# Patient Record
Sex: Male | Born: 1955 | Race: White | Hispanic: No | Marital: Married | Smoking: Former smoker
Health system: Southern US, Community
[De-identification: ages and names within clinical notes are randomized; demographics above are authoritative.]

## PROBLEM LIST (undated history)

## (undated) DIAGNOSIS — I6529 Occlusion and stenosis of unspecified carotid artery: Secondary | ICD-10-CM

## (undated) DIAGNOSIS — I10 Essential (primary) hypertension: Secondary | ICD-10-CM

## (undated) HISTORY — PX: CAROTID STENT: SHX1301

## (undated) HISTORY — PX: TOE SURGERY: SHX1073

## (undated) HISTORY — DX: Occlusion and stenosis of unspecified carotid artery: I65.29

## (undated) HISTORY — PX: AORTA SURGERY: SHX548

## (undated) HISTORY — DX: Essential (primary) hypertension: I10

---

## 2005-02-06 ENCOUNTER — Emergency Department: Payer: Self-pay | Admitting: General Practice

## 2005-02-12 ENCOUNTER — Ambulatory Visit: Payer: Self-pay | Admitting: Podiatry

## 2006-10-25 ENCOUNTER — Emergency Department: Payer: Self-pay | Admitting: Emergency Medicine

## 2006-10-28 ENCOUNTER — Inpatient Hospital Stay (HOSPITAL_COMMUNITY): Admission: RE | Admit: 2006-10-28 | Discharge: 2006-11-02 | Payer: Self-pay | Admitting: Vascular Surgery

## 2006-10-28 ENCOUNTER — Encounter (INDEPENDENT_AMBULATORY_CARE_PROVIDER_SITE_OTHER): Payer: Self-pay | Admitting: Specialist

## 2006-10-28 ENCOUNTER — Encounter: Payer: Self-pay | Admitting: Vascular Surgery

## 2007-07-04 ENCOUNTER — Ambulatory Visit: Payer: Self-pay | Admitting: Vascular Surgery

## 2008-05-23 ENCOUNTER — Ambulatory Visit: Payer: Self-pay | Admitting: Vascular Surgery

## 2008-11-20 ENCOUNTER — Ambulatory Visit: Payer: Self-pay | Admitting: Vascular Surgery

## 2011-03-09 NOTE — Procedures (Signed)
BYPASS GRAFT EVALUATION   INDICATION:  Followup aortic stenosis.   HISTORY:  Diabetes:  No.  Cardiac:  No.  Hypertension:  No.  Smoking:  Yes.  Previous Surgery:  Status post aortobifem bypass graft 10/28/2006 by Dr.  Edilia Bo.   SINGLE LEVEL ARTERIAL EXAM                               RIGHT              LEFT  Brachial:                    160                152  Anterior tibial:             171                158  Posterior tibial:            183                161  Peroneal:  Ankle/brachial index:        1.14               1.01   PREVIOUS ABI:  Date: 10/28/2006  RIGHT:  1.11  LEFT:  1.06   LOWER EXTREMITY BYPASS GRAFT DUPLEX EXAM:  See attached sheet for  velocities.   DUPLEX:  Evidence of triphasic Doppler wave forms proximal to, within,  and distal to bypass graft.   IMPRESSION:  1. Patent aortobifem bypass graft.  2. Normal ABIs showing no significant changes from previous study.   ___________________________________________  Di Kindle. Edilia Bo, M.D.   AS/MEDQ  D:  07/04/2007  T:  07/05/2007  Job:  [id number]

## 2011-03-09 NOTE — Procedures (Signed)
BYPASS GRAFT EVALUATION   INDICATION:  Follow-up aortobifemoral bypass graft.   HISTORY:  Diabetes:  No.  Cardiac:  No.  Hypertension:  No.  Smoking:  Yes.  Previous Surgery:  Aortobifemoral bypass graft on 10/28/06.   SINGLE LEVEL ARTERIAL EXAM                               RIGHT              LEFT  Brachial:                    142                128  Anterior tibial:             152                144  Posterior tibial:            148                154  Peroneal:  Ankle/brachial index:        1.07               1.08   PREVIOUS ABI:  Date: 07/04/07  RIGHT:  1.14  LEFT:  1.01   LOWER EXTREMITY BYPASS GRAFT DUPLEX EXAM:   DUPLEX:  Biphasic to triphasic waveforms noted throughout the  aortobifemoral bypass graft and its native vessels with no increase in  Doppler velocities noted.   IMPRESSION:  1. Patent aortobifemoral bypass graft with no evidence of stenosis      noted.  2. Stable bilateral ankle brachial indices noted.   ___________________________________________  Di Kindle. Edilia Bo, M.D.   CH/MEDQ  D:  05/23/2008  T:  05/23/2008  Job:  [id number]

## 2011-03-09 NOTE — Assessment & Plan Note (Signed)
OFFICE VISIT   [name], [name]  DOB:  [birth year]                                       07/04/2007  CHART#:[id number]   I saw [name] in the office today for followup after his previous  aorto-femoral bypass graft.  He had presented with a long history of  claudication, and his symptoms had acutely worsened in December 2007.  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  He  underwent aorto-femoral bypass grafting on 10/28/2006.  He comes in for  a routine followup visit.  Since I saw him last he has had no  claudication, rest pain or nonhealing ulcers.  His appetite has returned  to normal, and he is working without any problems.   On review of systems he has had no chest pain, chest pressure,  palpitations or arrhythmias.  He has had no bronchitis, asthma or  wheezing.   On physical examination blood pressure is 136/80, heart rate is 68.  I  do not detect any carotid bruits.  Lungs are clear bilaterally to  auscultation.  On cardiac exam he has a regular rate and rhythm. His  abdomen is soft and nontender.  His incision has healed nicely.  He has  palpable femoral, dorsalis pedis and posterior tibial pulses  bilaterally.  He has no significant lower extremity swelling.  His groin  incisions look fine.   Overall I am pleased with his progress.  We have again discussed the  importance of tobacco cessation.  I plan on seeing him back in 1 year.  He knows to call sooner if he has problems.  We  have also discussed the use of prophylactic antibiotics if he has any  invasive procedures performed.   Di Kindle. Edilia Bo, M.D.  Electronically Signed   CSD/MEDQ  D:  07/04/2007  T:  07/05/2007  Job:  [id number]

## 2011-03-12 NOTE — Discharge Summary (Signed)
[name], [name] NO.:  [id number]   MEDICAL RECORD NO.:  [id number]          PATIENT TYPE:  INP   LOCATION:  2018                         FACILITY:  [facility]   PHYSICIAN:  Di Kindle. Edilia Bo, M.D.DATE OF BIRTH:  09/03/1956   DATE OF ADMISSION:  10/28/2006  DATE OF DISCHARGE:                               DISCHARGE SUMMARY   DATE OF DISCHARGE:  Tentatively 1-2 days.   ADMISSION DIAGNOSIS:  Aortic occlusion with disabling claudication and  rest pain.   DISCHARGE DIAGNOSES:  1. Aortic occlusion, status post aortobifemoral bypass graft.  2. Postoperative incision with mild cellulitis.  3. Postoperative leukocytosis which resolved.  4. Questionable hypercholesterolemia.   CONSULTANT:  None.   PROCEDURES:  October 28, 2006, the patient underwent aortobifemoral  bypass grafting by Dr. Edilia Bo.   HISTORY AND PHYSICAL:  This is a 55 year old gentleman who presented  with a three to four-years' long history of hip, thigh, and calf  claudication.  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  The patient  was brought in for aortofemoral bypass grafting.  Please see dictated  history and physical for further details.   HOSPITAL COURSE:  The patient's hospital course has been uneventful and  the patient has been progressing quite well.  The patient underwent  aortofemoral bypass grafting on October 28, 2006, by Dr. Edilia Bo without  any complications.  The patient was then admitted to the SICU.  He was  extubated post-op day #1 and comfortable.  The patient did receive with  a PCA for pain control.  The patient did tolerate this quite well.  The  patient was able to start p.o. pain medications by post-op day #3.  The  patient was started on a nicotine patch on  post-op day #1.  The patient  has remained hemodynamically stable and did not require any blood  transfusions.  His hemoglobin hematocrit have been around 11.2 and 31.8.  The patient's renal function has remained intact, a BUN 13, creatinine  1.2.  Post-op day #2, the patient did have some leukocytosis.  The  patient remained afebrile, did have a urinalysis checked.  The patient's  urinalysis was negative.  However on post-op day #3, the patient was  found to have mild cellulitis on his abdominal incision site and he was  started on Keflex.  Post-op day #4, the patient's temperature was back  down to 96.8 and his cellulitis did slightly improve.  Postoperatively, the patient was positive for flatus on post-op day #2.  He did have a bowel movement on post-op  day #3.  The patient does have  good bowel sounds on post-op day #2.  He was started on liquids post-op  day #3 and his diet was advanced as tolerated.  The patient is  ambulating well.  He has continued his breathing exercises  appropriately.  The patient's blood pressure has remained  well-  controlled in the lower 120s over 60s.  He is breathing on room air at  91% oxygen sat.   DISCHARGE PHYSICAL EXAMINATION:  VITAL SIGNS:  The patient is afebrile  at 96.8.  Blood pressure 116/68.  Heart rate 85.  O2 sat is 97%.  SKIN:  His incision shows a mild cellulitis improvement.  ABDOMEN:  Positive bowel sounds x4.  Soft, nontender, nondistended.  LUNGS:  Clear to auscultation bilaterally.  HEART:  Regular rate rhythm.  Palpable pedal pulses.   DISCHARGE DISPOSITION:  The patient will be discharged home in the next  1-2 days.   MEDICATIONS:  1. Aspirin 325 mg p.o. daily.  2. Tylenol Sinus p.r.n.  3. Multivitamin daily.  4. Keflex 500 mg t.i.d. x12 days.  5. Tylox 5 mg every 4 hours.   INSTRUCTIONS:  The patient is instructed to follow a low fat, low salt  diet.  No driving or lifting greater than 10 pounds for 3 weeks.  The  patient is to ambulate 3-4 times daily and increase activity as  tolerated.  He is to continue his breathing exercises.  He may shower and clean his incisions with mild soap and water.   The patient is to call the office if any problems shall arise.   FOLLOWUP:  The patient has a followup appointment with Dr. Edilia Bo in 3  weeks.  The office will contact him with time and date of appointment.  At that time, the patient's staples will be removed.      Constance Holster, PA      Di Kindle. Edilia Bo, M.D.  Electronically Signed    JMW/MEDQ  D:  11/01/2006  T:  11/01/2006  Job:  [id number]

## 2011-03-12 NOTE — H&P (Signed)
[name], MANERS NO.:  [id number]   MEDICAL RECORD NO.:  [id number]           PATIENT TYPE:   LOCATION:                                 FACILITY:   PHYSICIAN:  Di Kindle. Edilia Bo, M.D.DATE OF BIRTH:  1956-07-09   DATE OF ADMISSION:  DATE OF DISCHARGE:                              HISTORY & PHYSICAL   REASON FOR ADMISSION:  Aortic occlusion.   HISTORY OF PRESENT ILLNESS:  This is a pleasant 55 year old gentleman  who gives a 3-4 year history of bilateral lower extremity claudication  that has involved his hips, thighs and calves.  Initially the symptoms  had begun on the right side but later involved both the right and left  legs.  However, symptoms had remained relatively stable and he could  walk generally approximately 300 yards before experiencing significant  disabling symptoms.  The symptoms had remained stable until October 17, 2006, when he experienced the fairly sudden onset of pain in both lower  extremities.  This pain persisted over the next week and yesterday he  presented to the emergency department at [facility],  where a CT angio documented occlusion of the aorta from the level the  renal arteries down to the iliac bifurcations.  The situation was  discussed with Dr. Hart Rochester, and he was brought in today to be seen in the  office.   This patient denies any specific rest pain in his legs, although he  states that since he had the acute change in his symptoms on October 17, 2006, he has had paresthesias in his legs.  He has had no history of  nonhealing wounds.   PAST MEDICAL HISTORY:  Fairly unremarkable.  He denies any history of  diabetes, hypertension, hypercholesterolemia, history of previous  myocardial infarction, history of congestive heart failure, or history  of COPD.  He does state that on his lab work at the emergency  department, his cholesterol was slightly elevated.   FAMILY HISTORY:  There is no  history of premature cardiovascular disease  that he is aware of.   SOCIAL HISTORY:  He is married and has two children.  He is an [employer] attorney.  He smokes a pack per day of cigarettes.   ALLERGIES:  No known drug allergies.   MEDICINES:  Aspirin 325 mg p.o. daily.   REVIEW OF SYSTEMS:  GENERAL:  He had some slight weight gain recently.  He is 200 pounds, 5 feet 11 inches.  He has had no changes in his  appetite or fever.  CARDIAC:  He has had no chest pain, chest pressure,  palpitations or arrhythmias.  He admits to some mild dyspnea on  exertion.  He has had no history of murmur.  PULMONARY:  He has had no  bronchitis, asthma or wheezing.  GI: He does have a history of reflux.  He has had no history of recent change in his bowel habits or peptic  ulcer disease.  GU: He has had no dysuria or frequency.  VASCULAR:  He  has had claudication as described above.  He has had no history of  stroke, TIAs, expressive or receptive aphasia, or amaurosis fugax.  He  has had no history of DVT or phlebitis.  NEUROLOGIC:  He has had no  dizziness, blackouts, headaches or seizures.  ORTHOPEDIC:  He has had  some joint pain and muscle pain.  PSYCHIATRIC:  He has had no depression  or nervousness.  HEMATOLOGIC:  He has had no bleeding problems or  clotting disorders.   PHYSICAL EXAMINATION:  VITAL SIGNS:  Blood pressure is 138/80, heart  rate is 64.  NECK:  I do not detect any carotid bruits.  LUNGS:  Clear bilaterally to auscultation.  CARDIAC:  He has a regular rate and rhythm.  ABDOMEN:  Soft and nontender.  He has had no previous abdominal surgery.  VASCULAR:  I cannot palpate femoral, popliteal or pedal pulses on either  side.  He has monophasic Doppler signals in both feet.  (He did have a Doppler study at the emergency department, which  reportedly showed an ABI of 50% on the right 45% on the left.  We plan  on repeating these in our office today.)   CT angiogram shows significant  atherosclerotic calcific disease of the  aortoiliac system with clot extending all the way up to the level of the  renal arteries.  There is reconstitution at the iliac bifurcation.   IMPRESSION:  This patient has likely had a long history of aortoiliac  occlusive disease, which explains his history of thigh, hip and calf  claudication.  He had an abrupt change in his symptoms on the 24th and  it is likely at this time that he occluded his aorta related to his  underlying occlusive disease.  He can barely walk around the house given  his symptoms and certainly has been unable to work.   I have recommended aortofemoral bypass grafting as really the best  option for revascularization.  We have discussed nonoperative therapy  with the tobacco cessation and structured walking; however, he really  cannot walk at this point.  We have discussed the indications for the  surgery and the potential complications including but not limited to  bleeding, wound healing problems, infection, graft infection, MI, renal  failure, pulmonary complications.  He understands that the risk of  mortality or major morbidity is approximately of 4-5%.  All of his  questions were answered and he is agreeable to proceed on October 28, 2006.      Di Kindle. Edilia Bo, M.D.  Electronically Signed     CSD/MEDQ  D:  10/26/2006  T:  10/26/2006  Job:  [id number]

## 2011-03-12 NOTE — Op Note (Signed)
[name], [name] NO.:  [id number]   MEDICAL RECORD NO.:  [id number]          PATIENT TYPE:  INP   LOCATION:  2853                         FACILITY:  [facility]   PHYSICIAN:  Di Kindle. Edilia Bo, M.D.DATE OF BIRTH:  14-Nov-1955   DATE OF PROCEDURE:  10/28/2006  DATE OF DISCHARGE:                               OPERATIVE REPORT   PREOPERATIVE DIAGNOSIS:  Aortic occlusion with disabling claudication  and rest pain.   POSTOPERATIVE DIAGNOSIS:  Aortic occlusion with disabling claudication  and rest pain.   PROCEDURE:  Aortofemoral bypass grafting.   SURGEON:  Di Kindle. Edilia Bo, M.D.   ASSISTANTS:  Larina Earthly, M.D., and Rowe Clack, P.A.-C.   ANESTHESIA:  General.   INDICATIONS:  This is a 55 year old gentleman who presented with a 3-4-  year history of hip, thigh and calf claudication.  The patient had a  sudden increase in symptoms on October 17, 2006, and initially tried to  ride this out but ultimately presented to the emergency department with  disabling pain in both legs and on a CT scan was found to have a  juxtarenal aortic occlusion.  The patient was brought in for  aortofemoral bypass grafting.   TECHNIQUE:  The patient was taken to the operating room, received a  general anesthetic.  The abdomen and both groins and thighs were prepped  and draped in the usual sterile fashion.  Through longitudinal incisions  in both groins, the common femoral, superficial femoral and deep femoral  arteries were controlled with vessel loops.  The abdomen was entered  through a midline incision and upon careful exploration, no other intra-  abdominal pathology was noted.  The transverse colon was reflected  superiorly and the small bowel reflected to the right.  Retroperitoneal  tissue was divided.  The aorta was dissected free up to the level of the  renal vein.  Of note, there was significant inflammation around the  aorta likely related to the chronic  atherosclerotic disease.  The  thrombus within the aorta extended all the way up to the level of the  renal arteries.  Therefore, I felt it necessary to place a suprarenal  clamp to assure that there was no embolic disease to the kidneys.  Both  renal arteries were controlled with blue vessel loops and the suprarenal  aorta exposed for placement of a clamp.  Distally the dissection was  carried down to the bifurcation and retroperitoneal tunnels were created  to both incisions.  A 16 x 8 mm Dacron graft was selected.  The patient  was then heparinized and also received 25 g of mannitol.  Of note, to  allow adequate exposure I did have to divide the renal vein by clamping  of both ends, dividing and preserving all branches, and then oversewing  both ends with a 5-0 Prolene suture.  This allowed adequate exposure of  the suprarenal aorta.  The suprarenal aorta was then clamped.  The  renals were controlled with blue vessel loops.  The aorta was divided  transversely well below the level of the renal arteries and  a segment  was excised.  There was no bleeding from below as aorta was clotted, but  this was oversewn with a running 3-0 Prolene suture.  Next, all the  thrombus and debris was removed from the aorta up to the patent segment  and then irrigated with copious amounts of saline.  The graft was then  cut to the appropriate length and using a felt cuff sewn end-to-end to  the infrarenal aorta.  This was done with 3-0 Prolene suture.  Of note,  prior to performing anastomosis I did move the clamp below the renals  and reperfused the renal arteries.  The suprarenal clamp was on for  approximately 5 minutes.  After the proximal anastomosis was tested, the  graft was flushed and then reclamped.  The limbs were then brought down  to the respective groins and using a two-team approach, the anastomoses  were performed at both ends end-to-side with 5-0 Prolene.  On the right  side the common  femoral, deep femoral and superficial femoral artery  were controlled and a longitudinal arteriotomy was made.  The right limb  of the graft was cut to the appropriate length, spatulated and sewn end-  to-side to the common femoral artery just above the bifurcation with  continuous 5-0 Prolene suture.  Prior to completing the anastomosis, the  arteries were backbled and flushed appropriately and the anastomosis  completed.  Flow was reestablished to the right leg, which the patient  tolerated from a hemodynamic standpoint.  On the left side the  bifurcation was higher, so this anastomosis extended onto the  superficial femoral artery.  The vessels were controlled, a longitudinal  arteriotomy was made.  The left limb of the graft was cut to the  appropriate length, spatulated and sewn end-to-side to the common  femoral artery extending onto the superficial femoral artery with  continuous 5-0 Prolene suture.  At completion there was a good flow in  both feet and the patient tolerated this from a hemodynamic standpoint.  Hemostasis was obtained in the wounds.  The abdomen was irrigated with  copious amounts of saline.  The retroperitoneal tissue was closed with a  running 2-0 Vicryl.  The abdominal contents were returned to their  normal position and the colon was inspected, which was well-perfused.  The fascial layer was then closed with two #1 PDS sutures.  The skin was  closed with staples.  The groin incisions were closed with a deep layer  of 2-0 Vicryl, a subcutaneous layer of 2-0 Vicryl, and the skin closed  with staples.  A sterile dressing was applied.  The patient tolerated  the procedure well and was transferred to the recovery room in  satisfactory condition.  All needle and sponge counts were correct.  The  care was turned over to Dr. Joeseph Amor for postoperative management,  and he will be managing the patient in the intensive care unit for the remainder of the hospital  stay.      Di Kindle. Edilia Bo, M.D.  Electronically Signed     CSD/MEDQ  D:  10/28/2006  T:  10/28/2006  Job:  [id number]

## 2012-10-20 ENCOUNTER — Encounter: Payer: Self-pay | Admitting: Vascular Surgery

## 2013-08-01 ENCOUNTER — Ambulatory Visit: Payer: Self-pay | Admitting: Family

## 2014-10-26 ENCOUNTER — Inpatient Hospital Stay: Payer: Self-pay | Admitting: Internal Medicine

## 2014-10-26 LAB — CBC WITH DIFFERENTIAL/PLATELET
BASOS ABS: 0.1 10*3/uL (ref 0.0–0.1)
BASOS PCT: 1.4 %
EOS ABS: 0.3 10*3/uL (ref 0.0–0.7)
Eosinophil %: 6.2 %
HCT: 41.5 % (ref 40.0–52.0)
HGB: 13.9 g/dL (ref 13.0–18.0)
LYMPHS ABS: 1.8 10*3/uL (ref 1.0–3.6)
Lymphocyte %: 32.4 %
MCH: 32 pg (ref 26.0–34.0)
MCHC: 33.6 g/dL (ref 32.0–36.0)
MCV: 95 fL (ref 80–100)
MONO ABS: 0.5 x10 3/mm (ref 0.2–1.0)
Monocyte %: 9.5 %
NEUTROS ABS: 2.9 10*3/uL (ref 1.4–6.5)
Neutrophil %: 50.5 %
Platelet: 217 10*3/uL (ref 150–440)
RBC: 4.36 10*6/uL — ABNORMAL LOW (ref 4.40–5.90)
RDW: 12.6 % (ref 11.5–14.5)
WBC: 5.7 10*3/uL (ref 3.8–10.6)

## 2014-10-26 LAB — COMPREHENSIVE METABOLIC PANEL
ALK PHOS: 74 U/L
ALT: 43 U/L
ANION GAP: 7 (ref 7–16)
AST: 86 U/L — AB (ref 15–37)
Albumin: 3.6 g/dL (ref 3.4–5.0)
BUN: 15 mg/dL (ref 7–18)
Bilirubin,Total: 0.6 mg/dL (ref 0.2–1.0)
CALCIUM: 8.5 mg/dL (ref 8.5–10.1)
CHLORIDE: 111 mmol/L — AB (ref 98–107)
CO2: 22 mmol/L (ref 21–32)
CREATININE: 1.47 mg/dL — AB (ref 0.60–1.30)
GFR CALC NON AF AMER: 52 — AB
Glucose: 194 mg/dL — ABNORMAL HIGH (ref 65–99)
Osmolality: 286 (ref 275–301)
Potassium: 4.7 mmol/L (ref 3.5–5.1)
Sodium: 140 mmol/L (ref 136–145)
Total Protein: 7.6 g/dL (ref 6.4–8.2)

## 2014-10-26 LAB — URINALYSIS, COMPLETE
BACTERIA: NONE SEEN
BLOOD: NEGATIVE
Bilirubin,UR: NEGATIVE
Ketone: NEGATIVE
LEUKOCYTE ESTERASE: NEGATIVE
NITRITE: NEGATIVE
PH: 6 (ref 4.5–8.0)
Protein: NEGATIVE
RBC,UR: NONE SEEN /HPF (ref 0–5)
SPECIFIC GRAVITY: 1.003 (ref 1.003–1.030)
Squamous Epithelial: <1
WBC UR: <1 /HPF (ref 0–5)

## 2014-10-26 LAB — DRUG SCREEN, URINE
AMPHETAMINES, UR SCREEN: NEGATIVE (ref ?–1000)
BENZODIAZEPINE, UR SCRN: NEGATIVE (ref ?–200)
Barbiturates, Ur Screen: NEGATIVE (ref ?–200)
Cannabinoid 50 Ng, Ur ~~LOC~~: NEGATIVE (ref ?–50)
Cocaine Metabolite,Ur ~~LOC~~: NEGATIVE (ref ?–300)
MDMA (ECSTASY) UR SCREEN: NEGATIVE (ref ?–500)
METHADONE, UR SCREEN: NEGATIVE (ref ?–300)
OPIATE, UR SCREEN: NEGATIVE (ref ?–300)
Phencyclidine (PCP) Ur S: NEGATIVE (ref ?–25)
TRICYCLIC, UR SCREEN: NEGATIVE (ref ?–1000)

## 2014-10-26 LAB — TROPONIN I: Troponin-I: <0.02 ng/mL

## 2014-10-27 ENCOUNTER — Ambulatory Visit: Payer: Self-pay

## 2014-10-27 ENCOUNTER — Ambulatory Visit: Payer: Self-pay | Admitting: Neurology

## 2014-10-27 LAB — CBC WITH DIFFERENTIAL/PLATELET
BASOS PCT: 0.9 %
Basophil #: 0.1 10*3/uL (ref 0.0–0.1)
EOS PCT: 4.7 %
Eosinophil #: 0.5 10*3/uL (ref 0.0–0.7)
HCT: 38.1 % — AB (ref 40.0–52.0)
HGB: 13 g/dL (ref 13.0–18.0)
LYMPHS ABS: 3.2 10*3/uL (ref 1.0–3.6)
Lymphocyte %: 32.9 %
MCH: 32.2 pg (ref 26.0–34.0)
MCHC: 34 g/dL (ref 32.0–36.0)
MCV: 95 fL (ref 80–100)
MONO ABS: 1 x10 3/mm (ref 0.2–1.0)
MONOS PCT: 10.1 %
NEUTROS ABS: 5 10*3/uL (ref 1.4–6.5)
Neutrophil %: 51.4 %
PLATELETS: 207 10*3/uL (ref 150–440)
RBC: 4.02 10*6/uL — AB (ref 4.40–5.90)
RDW: 12.6 % (ref 11.5–14.5)
WBC: 9.7 10*3/uL (ref 3.8–10.6)

## 2014-10-27 LAB — BASIC METABOLIC PANEL
ANION GAP: 9 (ref 7–16)
BUN: 14 mg/dL (ref 7–18)
CALCIUM: 8.1 mg/dL — AB (ref 8.5–10.1)
CHLORIDE: 111 mmol/L — AB (ref 98–107)
Co2: 24 mmol/L (ref 21–32)
Creatinine: 1.51 mg/dL — ABNORMAL HIGH (ref 0.60–1.30)
EGFR (Non-African Amer.): 50 — ABNORMAL LOW
Glucose: 107 mg/dL — ABNORMAL HIGH (ref 65–99)
Osmolality: 288 (ref 275–301)
Potassium: 4 mmol/L (ref 3.5–5.1)
Sodium: 144 mmol/L (ref 136–145)

## 2014-10-27 LAB — CK: CK, Total: 57 U/L (ref 39–308)

## 2014-10-27 LAB — LIPID PANEL
CHOLESTEROL: 163 mg/dL (ref 0–200)
HDL Cholesterol: 35 mg/dL — ABNORMAL LOW (ref 40–60)
LDL CHOLESTEROL, CALC: 73 mg/dL (ref 0–100)
Triglycerides: 277 mg/dL — ABNORMAL HIGH (ref 0–200)
VLDL CHOLESTEROL, CALC: 55 mg/dL — AB (ref 5–40)

## 2014-10-28 LAB — BASIC METABOLIC PANEL
ANION GAP: 7 (ref 7–16)
BUN: 17 mg/dL (ref 7–18)
CREATININE: 1.44 mg/dL — AB (ref 0.60–1.30)
Calcium, Total: 8.3 mg/dL — ABNORMAL LOW (ref 8.5–10.1)
Chloride: 108 mmol/L — ABNORMAL HIGH (ref 98–107)
Co2: 24 mmol/L (ref 21–32)
EGFR (African American): >60
GFR CALC NON AF AMER: 53 — AB
GLUCOSE: 107 mg/dL — AB (ref 65–99)
OSMOLALITY: 280 (ref 275–301)
POTASSIUM: 4 mmol/L (ref 3.5–5.1)
Sodium: 139 mmol/L (ref 136–145)

## 2014-10-29 ENCOUNTER — Inpatient Hospital Stay: Payer: Self-pay | Admitting: Internal Medicine

## 2014-10-29 LAB — CBC WITH DIFFERENTIAL/PLATELET
BASOS PCT: 0.8 %
Basophil #: 0.1 10*3/uL (ref 0.0–0.1)
EOS ABS: 0.4 10*3/uL (ref 0.0–0.7)
EOS PCT: 5 %
HCT: 39.9 % — ABNORMAL LOW (ref 40.0–52.0)
HGB: 13.7 g/dL (ref 13.0–18.0)
LYMPHS PCT: 37.5 %
Lymphocyte #: 3.3 10*3/uL (ref 1.0–3.6)
MCH: 32.1 pg (ref 26.0–34.0)
MCHC: 34.4 g/dL (ref 32.0–36.0)
MCV: 93 fL (ref 80–100)
MONO ABS: 1.1 x10 3/mm — AB (ref 0.2–1.0)
Monocyte %: 12.7 %
NEUTROS PCT: 44 %
Neutrophil #: 3.9 10*3/uL (ref 1.4–6.5)
PLATELETS: 223 10*3/uL (ref 150–440)
RBC: 4.28 10*6/uL — AB (ref 4.40–5.90)
RDW: 12.9 % (ref 11.5–14.5)
WBC: 8.8 10*3/uL (ref 3.8–10.6)

## 2014-10-29 LAB — BASIC METABOLIC PANEL
Anion Gap: 9 (ref 7–16)
BUN: 24 mg/dL — AB (ref 7–18)
CO2: 23 mmol/L (ref 21–32)
CREATININE: 1.39 mg/dL — AB (ref 0.60–1.30)
Calcium, Total: 8.5 mg/dL (ref 8.5–10.1)
Chloride: 109 mmol/L — ABNORMAL HIGH (ref 98–107)
EGFR (African American): >60
EGFR (Non-African Amer.): 55 — ABNORMAL LOW
Glucose: 103 mg/dL — ABNORMAL HIGH (ref 65–99)
Osmolality: 286 (ref 275–301)
Potassium: 4.4 mmol/L (ref 3.5–5.1)
SODIUM: 141 mmol/L (ref 136–145)

## 2014-10-30 LAB — BASIC METABOLIC PANEL
Anion Gap: 6 — ABNORMAL LOW (ref 7–16)
BUN: 20 mg/dL — ABNORMAL HIGH (ref 7–18)
CALCIUM: 8 mg/dL — AB (ref 8.5–10.1)
CHLORIDE: 112 mmol/L — AB (ref 98–107)
Co2: 23 mmol/L (ref 21–32)
Creatinine: 1.33 mg/dL — ABNORMAL HIGH (ref 0.60–1.30)
EGFR (African American): >60
EGFR (Non-African Amer.): 58 — ABNORMAL LOW
Glucose: 92 mg/dL (ref 65–99)
OSMOLALITY: 284 (ref 275–301)
POTASSIUM: 4 mmol/L (ref 3.5–5.1)
SODIUM: 141 mmol/L (ref 136–145)

## 2014-10-31 LAB — CBC WITH DIFFERENTIAL/PLATELET
BASOS PCT: 0.5 %
Basophil #: 0.1 10*3/uL (ref 0.0–0.1)
Eosinophil #: 0.1 10*3/uL (ref 0.0–0.7)
Eosinophil %: 0.5 %
HCT: 32.7 % — ABNORMAL LOW (ref 40.0–52.0)
HGB: 11.4 g/dL — ABNORMAL LOW (ref 13.0–18.0)
LYMPHS ABS: 1.7 10*3/uL (ref 1.0–3.6)
LYMPHS PCT: 15.6 %
MCH: 32.3 pg (ref 26.0–34.0)
MCHC: 34.7 g/dL (ref 32.0–36.0)
MCV: 93 fL (ref 80–100)
MONOS PCT: 7.4 %
Monocyte #: 0.8 x10 3/mm (ref 0.2–1.0)
NEUTROS ABS: 8.3 10*3/uL — AB (ref 1.4–6.5)
Neutrophil %: 76 %
Platelet: 180 10*3/uL (ref 150–440)
RBC: 3.52 10*6/uL — AB (ref 4.40–5.90)
RDW: 12.6 % (ref 11.5–14.5)
WBC: 11 10*3/uL — AB (ref 3.8–10.6)

## 2014-10-31 LAB — APTT: ACTIVATED PTT: 32 s (ref 23.6–35.9)

## 2014-10-31 LAB — BASIC METABOLIC PANEL
Anion Gap: 9 (ref 7–16)
BUN: 18 mg/dL (ref 7–18)
CHLORIDE: 112 mmol/L — AB (ref 98–107)
CREATININE: 1.41 mg/dL — AB (ref 0.60–1.30)
Calcium, Total: 7.6 mg/dL — ABNORMAL LOW (ref 8.5–10.1)
Co2: 20 mmol/L — ABNORMAL LOW (ref 21–32)
EGFR (African American): >60
EGFR (Non-African Amer.): 54 — ABNORMAL LOW
Glucose: 125 mg/dL — ABNORMAL HIGH (ref 65–99)
Osmolality: 285 (ref 275–301)
POTASSIUM: 3.9 mmol/L (ref 3.5–5.1)
Sodium: 141 mmol/L (ref 136–145)

## 2014-10-31 LAB — PROTIME-INR
INR: 1.2
Prothrombin Time: 14.7 secs (ref 11.5–14.7)

## 2014-11-01 LAB — BASIC METABOLIC PANEL
ANION GAP: 7 (ref 7–16)
BUN: 16 mg/dL (ref 7–18)
CALCIUM: 7.6 mg/dL — AB (ref 8.5–10.1)
Chloride: 112 mmol/L — ABNORMAL HIGH (ref 98–107)
Co2: 22 mmol/L (ref 21–32)
Creatinine: 1.22 mg/dL (ref 0.60–1.30)
EGFR (African American): >60
GLUCOSE: 115 mg/dL — AB (ref 65–99)
OSMOLALITY: 283 (ref 275–301)
Potassium: 3.8 mmol/L (ref 3.5–5.1)
Sodium: 141 mmol/L (ref 136–145)

## 2014-11-01 LAB — CBC WITH DIFFERENTIAL/PLATELET
BASOS PCT: 0.3 %
Basophil #: 0 10*3/uL (ref 0.0–0.1)
EOS PCT: 0.9 %
Eosinophil #: 0.1 10*3/uL (ref 0.0–0.7)
HCT: 32.3 % — ABNORMAL LOW (ref 40.0–52.0)
HGB: 11 g/dL — ABNORMAL LOW (ref 13.0–18.0)
LYMPHS PCT: 14.9 %
Lymphocyte #: 1.6 10*3/uL (ref 1.0–3.6)
MCH: 32.3 pg (ref 26.0–34.0)
MCHC: 34.1 g/dL (ref 32.0–36.0)
MCV: 95 fL (ref 80–100)
Monocyte #: 1 x10 3/mm (ref 0.2–1.0)
Monocyte %: 9.5 %
NEUTROS ABS: 8.1 10*3/uL — AB (ref 1.4–6.5)
Neutrophil %: 74.4 %
PLATELETS: 171 10*3/uL (ref 150–440)
RBC: 3.41 10*6/uL — AB (ref 4.40–5.90)
RDW: 12.5 % (ref 11.5–14.5)
WBC: 10.9 10*3/uL — ABNORMAL HIGH (ref 3.8–10.6)

## 2014-11-02 LAB — BASIC METABOLIC PANEL
Anion Gap: 7 (ref 7–16)
BUN: 14 mg/dL (ref 7–18)
CALCIUM: 8 mg/dL — AB (ref 8.5–10.1)
CHLORIDE: 111 mmol/L — AB (ref 98–107)
Co2: 22 mmol/L (ref 21–32)
Creatinine: 1.18 mg/dL (ref 0.60–1.30)
EGFR (African American): >60
GLUCOSE: 108 mg/dL — AB (ref 65–99)
Osmolality: 280 (ref 275–301)
POTASSIUM: 3.8 mmol/L (ref 3.5–5.1)
SODIUM: 140 mmol/L (ref 136–145)

## 2014-11-06 LAB — CBC WITH DIFFERENTIAL/PLATELET
BASOS PCT: 0.6 %
Basophil #: 0 10*3/uL (ref 0.0–0.1)
EOS ABS: 0.2 10*3/uL (ref 0.0–0.7)
EOS PCT: 2.3 %
HCT: 29.4 % — ABNORMAL LOW (ref 40.0–52.0)
HGB: 9.9 g/dL — AB (ref 13.0–18.0)
Lymphocyte #: 2.1 10*3/uL (ref 1.0–3.6)
Lymphocyte %: 25.4 %
MCH: 31.8 pg (ref 26.0–34.0)
MCHC: 33.7 g/dL (ref 32.0–36.0)
MCV: 94 fL (ref 80–100)
Monocyte #: 1.1 x10 3/mm — ABNORMAL HIGH (ref 0.2–1.0)
Monocyte %: 13.7 %
NEUTROS ABS: 4.8 10*3/uL (ref 1.4–6.5)
Neutrophil %: 58 %
Platelet: 218 10*3/uL (ref 150–440)
RBC: 3.11 10*6/uL — AB (ref 4.40–5.90)
RDW: 12.8 % (ref 11.5–14.5)
WBC: 8.3 10*3/uL (ref 3.8–10.6)

## 2014-11-06 LAB — URIC ACID: URIC ACID: 3.6 mg/dL (ref 3.5–7.2)

## 2014-11-06 LAB — SEDIMENTATION RATE: Erythrocyte Sed Rate: 86 mm/hr — ABNORMAL HIGH (ref 0–20)

## 2014-11-07 LAB — CREATININE, SERUM
Creatinine: 1.19 mg/dL (ref 0.60–1.30)
EGFR (African American): >60

## 2015-02-19 NOTE — H&P (Signed)
PATIENT NAME:  [name], CARSTARPHEN MR#:  [id number] DATE OF BIRTH:  [birth year]  DATE OF ADMISSION:  10/26/2014  PRIMARY CARE PHYSICIAN: Corky Downs, MD   CHIEF COMPLAINT: Difficulty speaking and numbness of the right hand.   HISTORY OF PRESENT ILLNESS: This is a 59 year old man who this morning developed numbness of the right hand and could not talk. He had slurred speech, trouble getting out his words. He knew what he wanted to say, he just could not say it. This has been coming and going over the morning. His left arm was also hurting a little bit. He has been having a headache for a week or 2. The other week he had an upper torso and head numbness here in the ER. The patient had an episode of this slurred speech again, and hospitalist services were contacted for further evaluation.   PAST MEDICAL HISTORY: Hypertension, hyperlipidemia.   PAST SURGICAL HISTORY: Aortic bypass into the iliacs and a foot surgery.   ALLERGIES: No known drug allergies.   MEDICATIONS INCLUDE: Aspirin 81 mg daily 2 tablets, lisinopril 10 mg daily, Zocor 40 mg at bedtime.   SOCIAL HISTORY: Quit smoking 2-1/2 years ago; smoked since he was a teenager 1-2 packs per day. Occasional alcohol. No drug use. 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   FAMILY HISTORY: Father died of COPD. Mother living and healthy.   REVIEW OF SYSTEMS:  CONSTITUTIONAL: No fever, chills, or sweats. Occasional left arm weakness. No weight gain. No weight loss.  EYES: Does wear glasses.  EARS, NOSE, MOUTH AND THROAT: Positive for runny nose. No sore throat. No difficulty swallowing.  CARDIOVASCULAR: Slight chest pain. No palpitations.  RESPIRATORY: Occasionally does have shortness of breath at night. Occasional cough. No sputum. No hemoptysis.  GASTROINTESTINAL: No nausea. No vomiting. No abdominal pain. No diarrhea. No constipation. No bright red blood per rectum. No melena.  GENITOURINARY: No burning on urination or hematuria.   MUSCULOSKELETAL: No joint pain or muscle pain.  INTEGUMENT: Sometimes some itching in the rectal area.  NEUROLOGIC: No fainting or blackouts.  PSYCHIATRIC: No anxiety or depression.  ENDOCRINE: No thyroid problems.  HEMATOLOGIC AND LYMPHATIC: No anemia.   PHYSICAL EXAMINATION:  VITAL SIGNS: Temperature 98.5, pulse 66, respirations 18, blood pressure 142/85, pulse oximetry 99% on room air.  GENERAL: No respiratory distress.  EYES: Conjunctivae and lids normal. Pupils equal, round and reactive to light. Extraocular muscles intact. No nystagmus.  EARS, NOSE, MOUTH AND THROAT: Tympanic Membranes: No erythema. Nasal Mucosa: No erythema. Throat: No erythema. No exudate seen. Lips and Gums: No lesions.  NECK: No JVD. No bruits. No lymphadenopathy. No thyromegaly. No thyroid nodules palpated.  RESPIRATORY: Lungs clear to auscultation. No use of accessory muscles to breathe. No rhonchi, rales, or wheeze heard.  CARDIOVASCULAR: S1, S2 normal. No gallops, rubs, or murmurs heard. Carotid upstroke 2+ bilaterally; no bruits. Dorsalis pedis pulses 2+ bilaterally. No edema of the lower extremities.  ABDOMEN: Soft, nontender. No organomegaly or splenomegaly. Normoactive bowel sounds. No masses felt.  LYMPHATIC: No lymph nodes in the neck.  MUSCULOSKELETAL: No clubbing, edema, or cyanosis.  SKIN: Tan in the area of the "V" of the neck and face. No ulcers or lesions seen.  NEUROLOGICAL: Cranial nerves II through XII grossly intact. Intermittent speech issues. Power 5/5 upper and lower extremities. Sensation slightly decreased, right upper extremity, to light touch. The patient feels that his coordination is a little bit off on his right hand doing rapid finger motions. Babinski  negative.  PSYCHIATRIC: The patient is alert and oriented to person, place, and time.   LABORATORY AND RADIOLOGICAL DATA: Urinalysis negative. Urine toxicology negative. Chest x-ray shows a 12 mm right lower lobe pulmonary nodule, CT  scan of the head shows no acute intracranial abnormality, left mastoid effusion. White blood cell count 5.7, H and H 13.9 and 41.5, platelet count of 217,000. Glucose 194, BUN 15, creatinine 1.47, sodium 140, potassium 4.7, chloride 111, CO2 of 22, calcium 8.5. Liver Function Tests: AST slightly elevated at 86. Troponin negative.   ELECTROCARDIOGRAM: Normal sinus rhythm.   ASSESSMENT AND PLAN:  1.  Transient ischemic attack with intermittent slurred speech and right arm numbness. Since the patient does take an aspirin at home, we will take a step up in treatment and give Plavix. We will get an MRI of the brain, carotid ultrasound, echocardiogram with bubble study. We will change, simvastatin to Lipitor high dose, and check a lipid profile. We will get speech therapy and occupational therapy to see the patient.  2.  Hypertension. Blood pressure borderline on his current medication. I am okay with that at this point.  3.  Hyperlipidemia. He was told recently that his LDL was a little high and HDL a little low. We will give the high-dose statin with concern for stroke.  4.  Pulmonary nodule with a history of smoking in the past. Will need further evaluation of this as an outpatient.  5.  Elevated liver function tests. We will screen for hepatitis C. This could also be statin related.  6.  I will continue to monitor.  TIME SPENT ON ADMISSION: 55 minutes.    ____________________________ Herschell Dimesichard J. Renae GlossWieting, MD rjw:MT D: 10/26/2014 14:17:26 ET T: 10/26/2014 14:45:19 ET JOB#: [id number]  cc: Herschell Dimesichard J. Renae GlossWieting, MD, <Dictator> Corky DownsJaved Masoud, MD Salley ScarletICHARD J Eevee Borbon MD ELECTRONICALLY SIGNED 11/03/2014 15:09

## 2015-02-23 NOTE — Op Note (Signed)
PATIENT NAME:  [name], [name] MR#:  [id number] DATE OF BIRTH:  20-Jan-1956  DATE OF PROCEDURE:  10/30/2014  PREOPERATIVE DIAGNOSES: 1.  Right carotid artery stenosis with 2 recent transient ischemic attack symptoms and an MRI positive for stroke on the left.  2.  Left carotid occlusion, likely chronic.  3.  Peripheral vascular disease, status post aortobifemoral bypass.   POSTOPERATIVE DIAGNOSES:  1.  Right carotid artery stenosis with 2 recent transient ischemic attack symptoms and an MRI positive for stroke on the left.  2.  Left carotid occlusion, likely chronic.  3.  Peripheral vascular disease, status post aortobifemoral bypass.   PROCEDURES: 1.  Ultrasound guidance for vascular access, right femoral artery.  2.  Catheter placement to right internal carotid artery from right femoral approach.  3.  Thoracic aortogram and cervical and cerebral right carotid angiogram.  4.  Placement of a 9 x 7,  3 cm long stent right carotid artery with the use of the NAV6 Emboshield embolic protection device.   SURGEON: Annice NeedyJason S Taneshia Lorence, MD   ANESTHESIA: Local with moderate conscious sedation.   BLOOD LOSS: 50 mL.  FLUOROSCOPY TIME: Approximately 7 minutes.  CONTRAST USED: 80 mL.   INDICATION FOR PROCEDURE: A 59 year old gentleman with 2 recent admissions for strokelike symptoms.  He was actually found to have an MRI positive for stroke on the left. He has a chronic left carotid artery occlusion with what was estimated to be at least a 60%-70% stenosis in the right ICA. My interpretation of the CT scan would suggest this was actually a higher degree of stenosis than that.  He has been on maximal medical therapy. Given his findings, he is brought in for intervention. The risks and benefits were discussed. Informed consent was obtained.   DESCRIPTION OF THE PROCEDURE: The patient is brought to the vascular suite. Groins were shaved and prepped, and a sterile surgical field was created. The right femoral  artery was visualized with ultrasound and found to be widely patent. This was accessed just below the area where it appeared the graft was plugged into from his aortobifemoral bypass. Initially the wire and catheter tracked up the native system, but with redirection with a Glidewire and a Kumpe catheter,  I was able to gain access into the bypass graft and advanced and placed a 5 French sheath. A pigtail catheter was placed in the ascending aorta and LAO projection thoracic aortogram was performed. This demonstrated a normal configuration of the great vessels. I then used a Headhunter catheter to selectively cannulate the innominate artery and advanced into the right common carotid artery. Imaging was performed to opacify the carotid bifurcation. The patient was given a total 7000 units of intravenous heparin. The catheter was passed into the external carotid artery with a stiff angled Glidewire and the Glidewire was removed. We then placed an Amplatz Super Stiff wire in the external carotid artery, removing the diagnostic catheter and the 5 French sheath and a 6 JamaicaFrench Shuttle sheath was parked in the distal main common carotid artery.  Imaging was performed which showed about an 80% stenosis in the right internal carotid artery over a very short segment near its origin. This was seen in both the RAO and the lateral projection. Intracranial filling was brisk in both the anterior and middle cerebral arteries on both sides with brisk cross-filling due to the left-sided occlusion. I was able to cross the lesion without difficulty with the NAV6 embolic protection device and parked this in  the distal main internal carotid artery. I selected a 9 x 7, 3 cm long tapered self-expanding stent and deployed this across the carotid bifurcation encompassing the lesion. It was post dilated with a 5 mm balloon with atropine given prophylactically to avoid bradycardia. The filter was then retrieved. Completion imaging showed the  stent to be in excellent location and widely patent with only about a 10% residual stenosis. The intracranial filling remained brisk without any intracranial filling defects seen in the right or the left. At this point, I elected to terminate the procedure. The sheath was removed. StarClose closure device was deployed in the usual fashion with excellent hemostatic result. The patient tolerated the procedure well and was taken to the recovery room in stable condition.     ____________________________ Annice Needy, MD jsd:LT D: 10/30/2014 15:17:54 ET T: 10/30/2014 22:21:31 ET JOB#: [id number]  cc: Annice Needy, MD, <Dictator> Annice Needy MD ELECTRONICALLY SIGNED 11/05/2014 13:39

## 2015-02-23 NOTE — Consult Note (Signed)
PATIENT NAME:  [name], [name] MR#:  [id number] DATE OF BIRTH:  02-23-1956  DATE OF CONSULTATION:  10/27/2014  REFERRING PHYSICIAN:   CONSULTING PHYSICIAN:  Pauletta BrownsYuriy Dravon Nott, MD  REASON FOR CONSULTATION:  Aphasia with right-sided numbness.  HISTORY OF PRESENT ILLNESS: This is a 59 year old gentleman who presented with right-sided numbness and aphasia since yesterday a.m. As per the patient, the symptoms have been coming and going. Throughout the day, symptoms have improved. The aphasia resolved, but the minimal right-sided numbness still persists. Status post imaging, and the patient has left insula and left frontal cortex that are consistent with the current symptoms. The patient was also found to have a complete left ICA occlusion. No string sign on ultrasound and CT angiogram.   PAST MEDICAL HISTORY: Hypertension and hyperlipidemia.   PAST SURGICAL HISTORY: Aortic bypass.   ALLERGIES: No known drug allergies.   HOME MEDICATIONS: Include aspirin 81 mg. The patient was started on Plavix here. The patient quit smoking about 2-1/2 years ago.   FAMILY HISTORY: The father died of COPD.   REVIEW OF SYSTEMS: No shortness of breath. No chest pain. No abdominal pain. Positive weakness on the right side of the body compared to the left. No anxiety. No depression.   PHYSICAL EXAMINATION:  NEUROLOGIC: Alert, awake and oriented to place, time, location and the reason why he is in the hospital. Facial sensation intact. Facial motor is intact. Right upper extremity drift is present. Otherwise, 5/5 bilaterally. Sensation: Slight decreased sensation on the right side of the face and right arm. Gait not could not be assessed.   IMPRESSION: A 59 year old gentleman presenting with aphasia and right-sided numbness that has improved. The right-sided numbness is still persistent, but the aphasia has resolved. Speech is close to baseline. The patient has a left carotid distal complete occlusion without any string  sign.    PLAN: Vascular followup as an outpatient probably in 6 months for further imaging. The patient is currently on Plavix. Continue that. Physical therapy and occupational therapy and discharge planning. This case was discussed with the primary team and the patient's family at the bedside.   Thank you. It was a pleasure seeing this patient.   ____________________________ Pauletta BrownsYuriy Ayiden Milliman, MD yz:JT D: 10/27/2014 13:26:58 ET T: 10/27/2014 16:01:00 ET JOB#: [id number]  cc: Pauletta BrownsYuriy Lorie Cleckley, MD, <Dictator> Pauletta BrownsYURIY Doralyn Kirkes MD ELECTRONICALLY SIGNED 11/12/2014 21:11

## 2015-02-23 NOTE — H&P (Signed)
PATIENT NAME:  [name], [name] MR#:  [id number] DATE OF BIRTH:  12-14-1955  DATE OF ADMISSION:  10/29/2014  REFERRING PHYSICIAN: Coolidge Breeze, MD    PRIMARY CARE DOCTOR: Barton Fanny. Hackney, NP  ADMIT DIAGNOSES: Recurrent right-sided numbness and new onset left upper extremity paresthesias.   HISTORY OF PRESENT ILLNESS: This is a 59 year old Caucasian male who presents to the Emergency Department complaining of new onset left upper extremity paresthesias. The patient was just discharged from the hospital the same day following workup for transient ischemic attack. The patient was actually found to have had small punctate strokes in his left frontal cortex as well as left insular cortex. He also was found to have a completely occluded left internal carotid artery and 50 to 69% occluded right ICA. The patient went home and states that he was feeling fine until about 4 hours prior to admission at which time he began feeling a pinpoint pressure close to the insertion point of his left pectoral major. The pain radiated into his shoulder and then began to ache in his left upper arm. He then began to feel "pins-and-needles" in his arm all the way down to his hand. The patient denies feeling any weakness in the extremity nor any numbness or decreased grip strength. He admits the pain radiated into his neck but felt somewhat different. It was not associated with palpitations or generalized chest pain. The patient also denies nausea, vomiting or diaphoresis. In the Emergency Department, the patient's symptoms seemed to resolve but would come and go. The Emergency Department staff discussed this case with neurology who recommended readmission for observation and repeat MRI, which prompted the Emergency Department staff to call for admission.   REVIEW OF SYSTEMS:  CONSTITUTIONAL: The patient denies fever or weakness.  EYES: Denies blurred vision or inflammation.  EARS, NOSE AND THROAT: Denies tinnitus or sore  throat.  RESPIRATORY: Denies cough or shortness of breath.  CARDIOVASCULAR: Admits to a pinpoint chest pain of his medial shoulder/lateral pectoral area that comes and goes. He denies any dyspnea on exertion, orthopnea, or paroxysmal nocturnal dyspnea.  GASTROINTESTINAL: Denies nausea, vomiting, diarrhea, or abdominal pain.  GENITOURINARY: Denies dysuria, increased frequency, or hesitancy of urination.  ENDOCRINE: Denies polyuria or polydipsia.  HEMATOLOGIC AND LYMPHATIC: Denies easy bruising or bleeding.  INTEGUMENTARY: Denies rashes or lesions.  MUSCULOSKELETAL: Denies arthralgias but admits to the myalgias as described above.  NEUROLOGIC: Admits to paresthesias in the left upper extremity and as well as some numbness and occasional weakness of his right grip strength that comes and goes. He denies dysarthria with this admission. However, he had some aphasia approximately 24 to 48 hours ago.  PSYCHIATRIC: Denies depression or suicidal ideation.   PAST MEDICAL HISTORY: Hypertension, hyperlipidemia, peripheral vascular disease, status post aortoiliac bypass.    SURGICAL HISTORY: Aortoiliac bypass as well as foot surgery.   SOCIAL HISTORY: The patient is married. He has quit smoking 2-1/2 years ago. He occasionally drinks alcohol. He denies illicit drug use.    FAMILY HISTORY: The patient's father is deceased of COPD.   MEDICATIONS:  1.  Aspirin 81 mg 2 tablets p.o. once daily.  2.  Atorvastatin 80 mg 1 tablet p.o. at bedtime.  3.  Clopidogrel 75 mg 1 tablet p.o. daily.   ALLERGIES: No known drug allergies.   PERTINENT LABORATORY RESULTS AND RADIOGRAPHIC FINDINGS: Serum glucose is 103, BUN 24, creatinine is 1.39, sodium 141, potassium 4.4, chloride 109, bicarbonate 23, calcium is 8.5, white blood cell count  8.8, hemoglobin 13.7, hematocrit 39.9, platelet count is 223,000, MCV is 93.   PHYSICAL EXAMINATION:  VITAL SIGNS: Temperature is 98.4, pulse 70, respirations 22, blood pressure  originally 183/94, now 120/84, pulse oximetry is 99% on room air.  GENERAL: The patient is alert and oriented x 3, in no apparent distress.  HEENT: Normocephalic, atraumatic. Pupils equal, round, and reactive to light and accommodation. Extraocular movements are intact. Mucous membranes are moist.  NECK: Trachea is midline. No adenopathy. Thyroid nonpalpable and nontender.  CHEST: Symmetric and atraumatic. There is no reproducible chest pain.  CARDIOVASCULAR: Regular rate and rhythm. Normal S1, S2. No rubs, clicks, or murmurs appreciated. The patient does not have any carotid bruits and has 2+ radial pulses bilaterally.  LUNGS: Clear to auscultation bilaterally. Normal effort and excursion.  ABDOMEN: Positive bowel sounds. Soft, nontender, nondistended. No hepatosplenomegaly.  GENITOURINARY: Deferred.  MUSCULOSKELETAL: The patient moves all 4 extremities equally. There is 5/5 strength in upper and lower extremities bilaterally.  SKIN: Warm and dry. No rashes or lesions.  EXTREMITIES: No clubbing, cyanosis, or edema.  NEUROLOGIC: Cranial nerves II through XII are grossly intact. The patient has no dysmetria nor difficulty with sensation to discriminative touch or pain or temperature.  PSYCHIATRIC: Mood is normal. Affect is congruent. The patient has excellent insight and judgment into his condition.   ASSESSMENT AND PLAN: This is a 59 year old male who is readmitted for recurrent right-sided numbness and new onset left upper extremity paresthesias. The patient's symptoms are concerning for an evolving cerebrovascular accident.  1.  Transient ischemic attack. The patient has a complete occlusion of his left internal carotid on ultrasound that is consistent with a lesion that would produce right arm numbness. His right carotid however is 50 to 69% occluded. The interpretation from radiology is a plaque ulceration or embolization from that lesion cannot be excluded. His left upper extremity symptom raise  some concern that this may be the case. We will continue aspirin and Plavix at this time. We will repeat an MRI in the morning per neurology and have them see  the patient in consultation.  2.  Hypertension, originally uncontrolled. Now the patient is normotensive. We will continue neuro checks on the patient every 4 hours to ensure that there is no watershed infarction from his lowering blood pressure. We will allow permissive hypertension for the next 24 to 48 hours following these stroke symptoms.  3.  Hyperlipidemia. Continue atorvastatin.  4.  Acute on chronic kidney injury. We will give the patient gentle hydration. His creatinine is marginally improved over his previous laboratory values.  5.  Pulmonary nodule. This can be worked up as an outpatient.  6.  Deep vein thrombosis prophylaxis. Heparin.  7.  Gastrointestinal prophylaxis. None.   CODE STATUS: The patient is a full code.   TIME SPENT ON ADMISSION ORDERS AND PATIENT CARE: Approximately 35 minutes.    ____________________________ Kelton PillarMichael S. Sheryle Hailiamond, MD msd:AT D: 10/29/2014 00:58:09 ET T: 10/29/2014 01:14:29 ET JOB#: [id number]  cc: Kelton PillarMichael S. Sheryle Hailiamond, MD, <Dictator> Kelton PillarMICHAEL S Artis Beggs MD ELECTRONICALLY SIGNED 11/19/2014 2:06

## 2015-02-23 NOTE — Discharge Summary (Signed)
PATIENT NAME:  [name], [name] MR#:  [id number] DATE OF BIRTH:  07/13/1956  DATE OF ADMISSION:  10/26/2014 DATE OF DISCHARGE:  10/28/2014  DISCHARGE DIAGNOSES: 1.  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  2.  Complete occlusion of left internal carotid artery and 60% stenosis of the right internal carotid artery.  3.  Hypertension.  4.  Hyperlipidemia.   DISCHARGE MEDICATIONS: 1.  Aspirin 81 mg oral daily.  2.  Atorvastatin 80 mg daily. 3.  Plavix 75 mg daily.   Lisinopril and simvastatin stopped.   DISCHARGE INSTRUCTIONS: Low-sodium, low-fat diet. Activity as tolerated. Follow up with Dr. Juel BurrowMasoud in 1 to 2 weeks and Dr. Gilda CreaseSchnier in 4 to 6 weeks.   CONSULTANTS: Dr. Loretha BrasilZeylikman with neurology, Dr. Myra GianottiBrabham of vascular surgery.   IMAGING STUDIES: CT scan of the head without contrast showed no acute abnormalities, mild left mastoid effusion.   CTA of the neck showed complete occlusion of left ICA and 60% stenosis of the right ICA.   MRI of the brain without contrast showed punctate acute nonhemorrhagic infarct involving the posterior left insular cortex and left frontal lobe. Occluded left ICA.    Echocardiogram showed normal EF, no source of embolus.   ADMITTING HISTORY AND PHYSICAL AND HOSPITAL COURSE: Please see detailed H and P dictated by Dr. Renae GlossWieting. In brief, a 59 year old male patient with history of hypertension presented to the hospital complaining of right-sided weakness, numbness and aphasia. The patient's symptoms had significantly improved by the time he presented to the Emergency Room. The patient was admitted to hospitalist service for further work-up and treatment.  1.  Acute CVA of the left frontal and insular cortex area. The patient was seen by neurology and vascular surgery after a complete left ICA occlusion was found. No interventions were advised. Plavix was added to his aspirin. The patient's statin medications have been increased secondary to  elevated LDL levels. The patient's symptoms have resolved. By the time of discharge does not have any rehab or speech needs. Has ambulated well and will be discharged home to follow up with primary care physician and also vascular surgery as outpatient.  2.  Hypertension. The patient was on lisinopril in the past, but presently this is being stopped as the patient's blood pressure needs to be in the high normal range secondary to the ICA occlusion.   Prior to discharge, the patient's motor strength is 5/5 in upper and lower extremities. Sensation was intact all over. No dysarthria.   TIME SPENT ON DAY OF DISCHARGE IN DISCHARGE ACTIVITY: 40 minutes.   ____________________________ Molinda BailiffSrikar R. Kanye Depree, MD srs:sb D: 10/29/2014 13:12:00 ET T: 10/29/2014 14:15:17 ET JOB#: [id number]  cc: Wardell HeathSrikar R. Jaivon Vanbeek, MD, <Dictator> Corky DownsJaved Masoud, MD Renford DillsGregory G. Schnier, MD  Orie FishermanSRIKAR R Latham Kinzler MD ELECTRONICALLY SIGNED 11/08/2014 12:49

## 2015-02-23 NOTE — Discharge Summary (Signed)
PATIENT NAME:  [name], [name] DATE OF BIRTH:  05/01/1956  DATE OF ADMISSION:  10/29/2014 DATE OF DISCHARGE:  11/08/2014  DISCHARGE DIAGNOSES:  1.  Gout flare.  2.  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 Wyn Quakerew, on aspirin and Plavix now.  3.  Relative hypotension on midodrine.   SECONDARY DIAGNOSES:   Hypertension, hyperlipidemia, peripheral vascular disease, status post aortoiliac bypass.    CONSULTATIONS:  1.  Vascular surgery, Dr. Festus BarrenJason Dew.  2.  Neurology, Dr. Mellody DrownMatthew Smith.  3.  Speech therapy.   PROCEDURES AND RADIOLOGY: As dictated in the interim discharge summary by Dr. Enedina FinnerSona Patel on January 12.   HISTORY AND SHORT HOSPITAL COURSE: The patient is a 59 year old male with above-mentioned medical problems who was admitted for symptoms of left upper extremity paresthesia, worrisome for TIA. Please see Dr. Casimiro NeedleMichael Diamond's dictated history and physical for further details. Please also see interim discharge summary dictated by Dr. Enedina FinnerSona Patel on January 12, which covers course from admission until then. Subsequently, the patient was slowly titrated off dopamine drip and was moved out of the CCU to the floor. The patient started having severe bilateral foot pain for which he was started on steroids, as this was thought to be gout flare. He did respond well to treatment with steroids, was feeling much better by January 15 and was discharged home in stable condition.   PERTINENT PHYSICAL EXAMINATION ON THE DATE OF DISCHARGE:   VITAL SIGNS: Temperature 97.6, heart rate 73 per minute, respirations 17 per minute, blood pressure 127/66. He was saturating 92% on room air.  CARDIOVASCULAR: S1, S2 normal. No murmurs, rubs, or gallop.  LUNGS: Clear to auscultation bilaterally. No wheezing, rales, rhonchi, crepitation.  ABDOMEN: Soft, benign.  NEUROLOGIC:  Nonfocal examination. All other physical examination remained at baseline.   DISCHARGE MEDICATIONS:     Medication Instructions  aspirin 81 mg oral tablet  2 tab(s) orally once a day   atorvastatin 80 mg oral tablet  1 tab(s) orally once a day (at bedtime)   clopidogrel 75 mg oral tablet  1 tab(s) orally once a day   fludrocortisone 0.1 mg oral tablet  1 tab(s) orally every 12 hours   midodrine 10 mg oral tablet  1 tab(s) orally 3 times a day   prednisone 10 mg oral tablet  Start at 60 mg and taper by 10 mg daily until complete    DISCHARGE DIET: Low fat, low cholesterol.   DISCHARGE ACTIVITY: As tolerated.   DISCHARGE INSTRUCTIONS AND FOLLOWUP: The patient was instructed to have diet of regular consistency with thin liquids, general aspiration precautions to be strictly followed, tray set up as necessary. He will need followup with Tina B. Hackney, NP, with [facility] in 1-2 weeks. [facility] and [facility] 4-6 weeks.   TOTAL TIME DISCHARGING THIS PATIENT: 45 minutes.    ____________________________ Ellamae SiaVipul S. Sherryll BurgerShah, MD vss:bm D: 11/08/2014 23:53:22 ET T: 11/09/2014 00:30:38 ET JOB#: [id number]  cc: Sanya Kobrin S. Sherryll BurgerShah, MD, <Dictator> Annice NeedyJason S. Dew, MD Barton Fannyina B. Bing NeighborsHackney, NP Troy SineMatthew C. Katrinka BlazingSmith, MD Ellamae SiaVIPUL S [facility] MD ELECTRONICALLY SIGNED 11/14/2014 10:12

## 2015-02-23 NOTE — Consult Note (Signed)
PATIENT NAME:  [name], GADD MR#:  [id number] DATE OF BIRTH:  27-Sep-1956  DATE OF CONSULTATION:  10/27/2014  CONSULTING PHYSICIAN:  Nada Libman, MD  PRIMARY CARE PHYSICIAN:  Corky Downs, MD  CHIEF COMPLAINT:  Slurred speech and right hand weakness.   HISTORY: This is a 59 year old gentleman with history of aortobifemoral bypass graft by Dr. Cari Caraway in 2008. He developed numbness in the right hand and the inability to verbalize his thoughts on the day of admission. It had been coming and going over the course of the morning. He does not report any right leg weakness. His numbness was in the right hand and upper shoulder.  He states that he has had a headache for approximately 1-2 weeks. He does report an episode about a month ago where he had numbness from his chest up to his head and both arms which lasted approximately several seconds and resolved. He denies trouble swallowing. The patient is medically managed for hyperlipidemia with a statin. He is on ACE inhibitor for hypertension. He has a history of smoking, but quit 2 years ago. He does take an aspirin occasionally.   PAST MEDICAL HISTORY: Hypertension, hyperlipidemia.   PAST SURGICAL HISTORY: Aortobifemoral bypass graft.   ALLERGIES: None.   MEDICATIONS: Aspirin 81 mg 2 tablets daily, lisinopril 10 mg daily, and Zocor 40 mg at night.   SOCIAL HISTORY: Quit smoking 2-1/2 years ago. Occasional alcohol, no illicit drugs.   FAMILY HISTORY: Positive for COPD in his father. His mother is healthy.   REVIEW OF SYSTEMS:  CONSTITUTIONAL:  No fevers or chills.  EYES:  Wears glasses.  EARS, NOSE, AND THROAT:  Positive for runny nose. Negative sore throat or difficulty swallowing.  CARDIOVASCULAR: No palpitations.  PULMONARY: No breathing difficulty.  GASTROINTESTINAL: No nausea, vomiting, or abdominal pain.  GENITOURINARY:  No dysuria.  MUSCULOSKELETAL: No joint or muscle pain.  SKIN: Occasional rectal itching.  NEUROLOGIC:  Please see HPI.  PSYCHIATRIC: Negative for anxiety or depression.   All other review of systems is negative.  PHYSICAL EXAMINATION:  VITAL SIGNS:  Temperature is 98.5, blood pressure is 175/89, respirations 20, O2 saturation is 100% on room air.  GENERAL: He is well appearing, in no acute distress.  HEENT: Slight mouth droop.  CARDIOVASCULAR: Regular rate and rhythm, palpable posterior tibial pulse bilaterally.  PULMONARY: Respirations are nonlabored.  ABDOMEN: Soft, nontender, midline incision is intact.  MUSCULOSKELETAL: No gross abnormalities.  EXTREMITIES: Warm and well perfused. No significant edema.  NEUROLOGIC:  Slightly decreased grip strength in the right arm. Slightly decreased sensation in the right arm.   PERTINENT LABORATORY VALUES: Creatinine is 1.47, AST is 86. Troponins are normal. Toxicology screen is negative. White blood cell count 5.7, hemoglobin 13.9. UA is negative.   DIAGNOSTIC IMAGING: CT angiogram shows complete occlusion of the left internal carotid artery, multiple focal atheromatous plaque within the right carotid bifurcation with approximately 60%-70% stenosis. Widely patent vertebral arteries bilaterally.   MRI: Punctate acute/subacute nonhemorrhagic infarct within the posterior left insular cortex.   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   PLAN: No acute vascular surgery intervention is needed at this time. The patient will require surveillance carotid ultrasound in 6 months to monitor the disease progression on the right carotid. The patient will require maximum medical therapy to minimize his future stroke risk. This would include checking his cholesterol panel and optimizing his statin medication. He will also need better management of his blood pressure as it is  significantly elevated at this time. This will need to be done as an outpatient as he would probably benefit from cerebral perfusion with slightly higher pressures at this time.  I will schedule the patient for an outpatient carotid ultrasound in 6 months.    ____________________________ Nada LibmanVance W. Brabham, MD vwb:LT D: 10/27/2014 19:48:13 ET T: 10/27/2014 21:20:59 ET JOB#: [id number]  cc: Nada LibmanVance W. Brabham, MD, <Dictator> Nada LibmanVANCE W BRABHAM MD ELECTRONICALLY SIGNED 12/03/2014 23:07

## 2015-02-23 NOTE — Consult Note (Signed)
Brief Consult Note: Comments: The patient stated his feet are much better today and did not want to be seen by me.  His uric acid is normal and xrays are also normal.  Electronic Signatures: Epimenio Sarinroxler, Alera Quevedo G (MD)  (Signed 15-Jan-16 10:54)  Authored: Brief Consult Note   Last Updated: 15-Jan-16 10:54 by Epimenio Sarinroxler, [place] Canty G (MD)

## 2015-02-23 NOTE — Consult Note (Signed)
CHIEF COMPLAINT and HISTORY:  Subjective/Chief Complaint new left sided arm numbness, left facial numbness, still with right arm symptoms as well.   History of Present Illness [name] who was recently admitted and discharged with TIA symtpoms involving speech and right arm symptoms.  Was home less than a day and returned with left arm numbness and left facial droop/numbness.  This has improved but not entirely resolved since his admission early this am.  He is on ASA 162 mg daily, Atorvastatin, and Plavix 75 mg daily.  His new symptoms prompted a vascular consult.  He was seen by a different vascular surgeon several days ago and with left hemispheric symptoms, medical managment alone was recommended.  Now with new symptoms, we are asked to see patient.   PAST MEDICAL/SURGICAL HISTORY:  Past Medical History:   Hyperlipidemia:    Hypertension:    L foot surgery:    CABG:   ALLERGIES:  Allergies:  No Known Allergies:   HOME MEDICATIONS:  Home Medications: Medication Instructions Status  atorvastatin 80 mg oral tablet 1 tab(s) orally once a day (at bedtime) Active  clopidogrel 75 mg oral tablet 1 tab(s) orally once a day Active  aspirin 81 mg oral tablet 2 tab(s) orally once a day Active   Family and Social History:  Family History Non-Contributory   Social History positive tobacco (Greater than 1 year), positive ETOH, negative Illicit drugs   + Tobacco Prior (greater than 1 year)   Place of Living Home   Review of Systems:  Subjective/Chief Complaint Neuro symptoms as her HPI.   No heat or cold intolerance No dysuria/hematuria No blurry or double vision No tinnitus or ear pain No rashes or ulcer   Fever/Chills No   Cough No   Sputum No   Abdominal Pain No   Diarrhea No   Constipation No   Nausea/Vomiting No   SOB/DOE No   Chest Pain No   Dysuria No   Tolerating PT Yes   Tolerating Diet Yes   Medications/Allergies Reviewed  Medications/Allergies reviewed   Physical Exam:  GEN well developed, well nourished   HEENT pink conjunctivae, moist oral mucosa   NECK No masses  trachea midline   RESP normal resp effort  no use of accessory muscles   CARD regular rate  positive carotid bruits  no JVD   VASCULAR ACCESS none   ABD denies tenderness  soft   GU no superpubic tenderness   LYMPH negative neck, negative axillae   EXTR negative cyanosis/clubbing, negative edema   SKIN normal to palpation, skin turgor good   NEURO cranial nerves intact, follows commands, motor/sensory function intact   PSYCH alert, A+O to time, place, person   LABS:  Laboratory Results: Cardiology:    04-Jan-16 21:27, ECG  Ventricular Rate 65  Atrial Rate 65  P-R Interval 198  QRS Duration 80  QT 382  QTc 397  P Axis 45  R Axis -15  T Axis 58  ECG interpretation   *** Poor data quality, interpretation may be adversely affected  Normal sinus rhythm  Normal ECG  When compared with ECG of 26-Oct-2014 11:30,  QT has shortened  ----------unconfirmed----------  Confirmed by OVERREAD, NOT ([id number]), editor PEARSON, BARBARA ([id number]) on 10/29/2014 8:25:45 AM  ECG   Routine Chem:    05-Jan-16 03:50, Basic Metabolic Panel (w/Total Calcium)  Glucose, Serum 103  BUN 24  Creatinine (comp) 1.39  Sodium, Serum 141  Potassium, Serum 4.4  Chloride,  Serum 109  CO2, Serum 23  Calcium (Total), Serum 8.5  Anion Gap 9  Osmolality (calc) 286  eGFR (African American) >60  eGFR (Non-African American) 55  eGFR values <31m/min/1.73 m2 may be an indication of chronic  kidney disease (CKD).  Calculated eGFR, using the MRDR Study equation, is useful in   patients with stable renal function.  The eGFR calculation will not be reliable in acutely ill patients  when serum creatinine is changing rapidly. It is not useful in  patients on dialysis. The eGFR calculation may not be applicable  to patients at the low and high extremes of body sizes,  pregnant  women, and vegetarians.  Routine Hem:    05-Jan-16 21:32, CBC Profile  WBC (CBC) 8.8  RBC (CBC) 4.28  Hemoglobin (CBC) 13.7  Hematocrit (CBC) 39.9  Platelet Count (CBC) 223  MCV 93  MCH 32.1  MCHC 34.4  RDW 12.9  Neutrophil % 44.0  Lymphocyte % 37.5  Monocyte % 12.7  Eosinophil % 5.0  Basophil % 0.8  Neutrophil # 3.9  Lymphocyte # 3.3  Monocyte # 1.1  Eosinophil # 0.4  Basophil # 0.1  Result(s) reported on 29 Oct 2014 at 12:24AM.   RADIOLOGY:  Radiology Results: XRay:    02-Jan-16 11:46, Chest Portable Single View  Chest Portable Single View  REASON FOR EXAM:    CVA  COMMENTS:       PROCEDURE: DXR - DXR PORTABLE CHEST SINGLE VIEW  - Oct 26 2014 11:46AM     CLINICAL DATA:  Initial evaluation for intermittent tingling right  arm, former smoker    EXAM:  PORTABLE CHEST - 1 VIEW    COMPARISON:  None.    FINDINGS:  Heart size and vascular pattern are normal. No infiltrate or  effusion. 12 mm pulmonary nodule right lower lobe. Left lung is  clear.     IMPRESSION:  12 mm right lower lobe pulmonary nodule. Consider CT thorax to  evaluate further.      Electronically Signed    By: RSkipper ClicheM.D.    On: 10/26/2014 12:19         Verified By: RRachael Fee M.D.,  [place]    02-Jan-16 15:05, [place] Bilateral  [place] Bilateral  REASON FOR EXAM:    slurred speach  COMMENTS:       PROCEDURE: [place] - [place] DOPPLER BILATERAL  - Oct 26 2014  3:05PM     CLINICAL DATA:  Slurred speech since this morning, right-sided  weakness, hypertension, coronary artery disease post CABG, previous  tobacco abuse    EXAM:  BILATERAL CAROTID DUPLEX ULTRASOUND    TECHNIQUE:  GPearline Cablesscale imaging, color Doppler and duplex ultrasound was  performed of bilateral carotid and vertebral arteries in the neck.  COMPARISON:  None.    REVIEW OF SYSTEMS:  Quantification of carotid stenosis is based on velocity parameters  that correlate the  residual internal carotid diameter with  NASCET-based stenosis levels, using the diameter of the distal  internal carotid lumen as the denominator for stenosis measurement.    The following velocity measurements were obtained:    PEAK SYSTOLIC/END DIASTOLIC    RIGHT    ICA:                     237/91cm/sec  CCA:  54/00QQ/PYP    SYSTOLIC ICA/CCA RATIO:  9.50    DIASTOLIC ICA/CCA RATIO: 9.32    ECA:                     159cm/sec    LEFT    ICA:                     No Flow distally    CCA:                     95/17cm/sec    ECA:                     163cm/sec  FINDINGS:  RIGHT CAROTID ARTERY: There is circumferential partially calcified  plaque in the right carotid bulb extending into the proximal ICA  without high-grade stenosis. The velocities reported on the imaging  are probably overestimated due to poor angle correction. Normal  waveforms and color Doppler signal.    RIGHT VERTEBRAL ARTERY:  Normal flow direction and waveform.    LEFT CAROTID ARTERY: There circumferential plaque in the carotid  bulb. No flow signal is identified in the ICA beyond its proximal  segment on color or power Doppler.    LEFT VERTEBRAL ARTERY: Normal flow direction and waveform.   IMPRESSION:  1. 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  2. Proximal right ICA plaque resulting in 50- 69% diameter stenosis.  The exam does not exclude plaque ulceration or embolization.  Continued surveillance recommended.      Electronically Signed    By: Arne Cleveland M.D.    On: 10/26/2014 15:11         Verified By: Kandis Cocking, M.D.,  LabUnknown:    02-Jan-16 11:43, CT Head Without Contrast  PACS Image    02-Jan-16 11:46, Chest Portable Single View  PACS Image    02-Jan-16 15:05, US Carotid Doppler Bilateral  PACS Image    02-Jan-16 17:16, MRI Brain Without Contrast  PACS Image    02-Jan-16 22:01, CT Angiography Neck (Carotids)  PACS Image    05-Jan-16 10:05,  MRI Brain  With/Without Contrast  PACS Image  MRI:    02-Jan-16 17:16, MRI Brain Without Contrast  MRI Brain Without Contrast  REASON FOR EXAM:    slurred speach  COMMENTS:       PROCEDURE: MR  - MR BRAIN WO CONTRAST  - Oct 26 2014  5:16PM     CLINICAL DATA:  Episode of slurred speech. Intermittent right arm  numbness. Loss of motor control in the right hand. The symptoms have  improved but persist. Left ICA occlusion.    EXAM:  MRI HEAD WITHOUT CONTRAST    TECHNIQUE:  Multiplanar, multiecho pulse sequences of the brain and surrounding  structureswere obtained without intravenous contrast.  COMPARISON:  CT head from thesame day. Carotid Doppler study from  the same day.    FINDINGS:  A punctate nonhemorrhagic acute/subacute infarct is present within  the left insular cortex. There is a second punctate nonhemorrhagic  infarct in the high left frontal lobe, anterior to the primary motor  cortex. No focal lesions are identified along the primary motor or  sensory cortex.    Subtle T2 changes are associated with the acute insular infarct.  Minimal scattered subcortical T2 hyperintensities are noted  otherwise.    The left internal carotid artery is occluded. Flow is present in the  right internal carotid artery and branch  vessels. Flow is present in  the posterior circulation.    The right maxillary sinus is near totally opacified. Circumferential  mucosal thickening is present in the left maxillary sinus. The  paranasal sinuses are clear. There is fluid in the left mastoid air  cells. No obstructing nasopharyngeal lesion is evident.     IMPRESSION:  1. Punctate acute/subacute nonhemorrhagic infarct involving the  posterior left insular cortex.  2. The second punctate nonhemorrhagic infarct is noted in the high  left frontal lobe, separate from the primary motor or sensory  cortex.  3. Minimal scattered subcortical T2 hyperintensities otherwise or  potentially within  normal limits for age.  4. Occluded left internal carotid artery.      Electronically Signed    By: Lawrence Santiago M.D.    On: 10/26/2014 17:25         Verified By: Resa Miner. MATTERN, M.D.,    05-Jan-16 10:05, MRI Brain  With/Without Contrast  MRI Brain  With/Without Contrast  REASON FOR EXAM:    stroke symptoms  COMMENTS:       PROCEDURE: MR  - MR BRAIN WO/W CONTRAST  - Oct 29 2014 10:05AM     CLINICAL DATA:  59 year old male recently discharged from hospital.  On recent prior admission patient had right arm numbness withloss  of control right hand. Now presenting with of left arm, chest and  face numbness and pain. Subsequent encounter.    EXAM:  MRI HEAD WITHOUT AND WITH CONTRAST    TECHNIQUE:  Multiplanar, multiecho pulse sequences of the brain and surrounding  structures were obtained without and with intravenous contrast.    CONTRAST:  17 cc MultiHance.    COMPARISON:  10/26/2014 CT angiogram of the neck. 10/26/2014 brain  MR.    FINDINGS:  Progression of left hemispheric infarcts now with moderate size  infarct involving portions of the posterior left subinsular region,  posterior left frontal operculum region with extension into the  posterior left frontal lobe as it borders the parietal lobe. Tiny  mid and posterior left frontal lobe infarct.    No right-sided infarct or posterior fossa infarct detected as a  cause of patient's new left-sided symptoms.    No intracranial hemorrhage.    Abnormal appearance of the left internal carotid artery consistent  with occlusion with reconstitution of flow superior cavernous/  supraclinoid segment    Right jugular bulb and sigmoid sinus are either underdeveloped or  occluded with collateral flow from right transverse sinus noted.    No intracranial mass or abnormal enhancement.    No hydrocephalus.  Prominent mucosal thickening right maxillary sinus and mild to  moderate mucosal thickening left maxillary  sinus.    Partially empty non expanded sella incidentally noted.     IMPRESSION:  Progression of left hemispheric infarcts now with moderate size  infarct involving portions of the posterior left subinsular region,  posterior left frontal operculum region with extension into the  posterior left frontal lobe. Tiny mid and posterior left frontal  lobe infarct.    No right-sided infarct or posterior fossa infarct detected as a  cause of patient's new left-sided symptoms.  No intracranial hemorrhage.    Abnormal appearance of the left internal carotid artery consistent  with occlusion with reconstitution of flow superior cavernous/  supraclinoid segment    Right jugular bulb and sigmoid sinus are either underdeveloped or  occluded with collateral flow from right transverse sinus noted.    No intracranial mass or  abnormal enhancement.    Prominent mucosal thickening right maxillary sinus and mild to  moderate mucosal thickening left maxillary sinus.    Electronically Signed    By: Chauncey Cruel M.D.    On: 10/29/2014 10:46         Verified By: Doug Sou, M.D.,  CT:    02-Jan-16 11:43, CT Head Without Contrast  CT Head Without Contrast  REASON FOR EXAM:    Numbness to R arm  COMMENTS:       PROCEDURE: CT  - CT HEAD WITHOUT CONTRAST  - Oct 26 2014 11:43AM     CLINICAL DATA:  Pt to ed with c/o right arm numbness, weakness, and  difficulty speaking about 930 am today, reports episode lasted about  5 min and has repeated intermittantly since. Pt states this has  happened before but that it went away quickly. Pt denies hx of  stroke, seizure, or ca. Initial encounter.    EXAM:  CT HEAD WITHOUT CONTRAST    TECHNIQUE:  Contiguous axialimages were obtained from the base of the skull  through the vertex without intravenous contrast.    COMPARISON:  None.    FINDINGS:  Sinuses/Soft tissues: Left mastoid effusion. Other paranasal sinuses  are clear.    Intracranial:  No mass lesion, hemorrhage, hydrocephalus, acute  infarct, intra-axial, or extra-axial fluid collection.     IMPRESSION:  1.  No acute intracranial abnormality.  2. Left mastoid effusion.  Electronically Signed    By: Abigail Miyamoto M.D.    On: 10/26/2014 12:16         Verified By: Areta Haber, M.D.,    02-Jan-16 22:01, CT Angiography Neck (Carotids)  CT Angiography Neck (Carotids)  REASON FOR EXAM:    cva, carotid stenosis  COMMENTS:       PROCEDURE: CT  - CT ANGIOGRAPHY NECK W/CONTRAST  - Oct 26 2014 10:01PM     CLINICAL DATA:  Stroke, evaluate carotid stenosis. Initial  evaluation.    EXAM:  CT ANGIOGRAPHY NECK    TECHNIQUE:  Multidetector CT imaging of the neck was performed using the  standard protocol during bolus administration of intravenous  contrast. Multiplanar CT image reconstructions and MIPs were  obtained to evaluate the vascular anatomy. Carotid stenosis  measurements (when applicable) are obtained utilizing NASCET  criteria, using the distal internal carotid diameter as the  denominator.    CONTRAST:  75 cc of Isovue 370.    COMPARISON:  Prior MRI from earlier the same day.    FINDINGS:  Aortic arch: Thevisualized aortic arch is of normal caliber with  normal 3 vessel morphology. Focal atheromatous plaque present at the  origin of the left subclavian artery without hemodynamically  significant stenosis. Subclavian arteries well opacified distally.  Right carotid system: The right common carotid artery is well  opacified to the level of the carotid bifurcation. Calcified and  noncalcified plaque present about the carotid bifurcation and  proximal right internal carotid artery. There is associatedstenosis  of approximately 70% by NASCET criteria within the proximal right  ICA. The area of involvement extends from the carotid bifurcation  and measures approximately 1 cm in length. Distally, the right ICA  is well opacified to the level of the  cavernous sinus.    Right external carotid artery and its branches are within normal  limits.    Left carotid system: Left common carotid artery well opacified to  the level of the carotid bifurcation without  hemodynamically  significant stenosis. Delete that  There is multi focal soft noncalcified plaque within the proximal  left ICA with associated multi focal luminal irregularity. Focal  outpouching extending from the lateral margin of the proximal left  ICA likely related to penetrating atherosclerotic plaque (series 8,  image 68). There is complete occlusion of the left ICA approximately  in 13 mm distal to the carotid bifurcation. The left ICA remains  occluded to the level of the skullbase. Some flow is seen within the  left petrous andcavernous segments, likely via retrograde flow  cross the circle of Willis from the contralateral right ICA system.    Short segment mild to moderate stenosis noted within the proximal  left external carotid artery. Left external carotid artery and its  branches are otherwise unremarkable.    Vertebral arteries:The vertebral arteries both arise from the  subclavian arteries. The vertebral arteries appear to be codominant.  Scattered calcified plaque present at the origin of the right  vertebral artery without significant stenosis. Vertebral are per  arteries well opacified along their entire course without  hemodynamically significant stenosis, dissection, or occlusion.    No acute soft tissue abnormality within the neck. No adenopathy.  Thyroid gland normal. Visualized superior mediastinum within normal  limits.    Visualized lungs are clear. Mild paraseptal and centrilobular  emphysematous changes noted.    No acute osseous abnormality. No worrisome lytic or blastic osseous  lesions.  Right maxillary sinus is nearly completely opacified. There is  scattered mucoperiosteal thickening within the left maxillary sinus.  Scattered fluid  density present within the in inferior left mastoid  air cells.     IMPRESSION:  1. 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 There is some  flow within the left petrous and cavernous segments of the left ICA  distally, likely via contralateral flow across the circle of Willis  via the contralateral right carotid artery system.  2. Multi focal atheromatous plaque about the right carotid  bifurcation and proximal right ICA with associated stenosis of  approximately 60-70% by NASCET criteria within the proximal right  ICA. The areaof involvement within the proximal right ICA measures  approximately 1 cm in length, extending distally from the carotid  bifurcation.  3. Widely patent vertebral arteries bilaterally.      Electronically Signed    By: Jeannine Boga M.D.    On: 10/27/2014 00:33         Verified By: Neomia Glass, M.D.,   ASSESSMENT AND PLAN:  Assessment/Admission Diagnosis left arm symptoms worrisome for new TIA right arm symptoms several days ago.  CTA officially read as 60-70% right ICA stenosis, left carotid occlusion.   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   Plan I have independently reviewed the CTA from 10/26/14.  The reported degree of stenosis of 60-70 may very well be an underestimate as it appears to be 70% or slightly worse to me.  Underestimation of CT scan findings seems to be a common occurrence at our institution.  Given his young age, contralateral occlusion, and clearly symptomatic situation aggressive care would generally be considered.  Now that his symptoms would correlate with right carotid disease, and as this is the carotid supplying both hemispheres, I would consider intervention for any lesion >60%.  With a contralateral occlusion, would consider percutaneous intervention to avoid clamping of the artery.  He has also had difficulty with  anesthesia  previously, and this would avoid that risk. If a lesion of less than 60% is encountered at angiography, no intervention would be performed.  Risks and benefits discussed with patient who is agreeable to proceed.  Will schedule for tomorrow.      level 4 consult   Electronic Signatures: Algernon Huxley (MD)  (Signed 05-Jan-16 15:42)  Authored: Chief Complaint and History, PAST MEDICAL/SURGICAL HISTORY, ALLERGIES, HOME MEDICATIONS, Family and Social History, Review of Systems, Physical Exam, LABS, RADIOLOGY, Assessment and Plan   Last Updated: 05-Jan-16 15:42 by Algernon Huxley (MD)

## 2016-02-13 IMAGING — MR MRI HEAD WITHOUT CONTRAST
10 series · 48 of 48 positions shown · non-contrast
Comparison: CT head from the same day. Carotid Doppler study from
the same day.

CLINICAL DATA: Episode of slurred speech. Intermittent right arm
numbness. Loss of motor control in the right hand. The symptoms have
improved but persist. Left ICA occlusion.

EXAM:
MRI HEAD WITHOUT CONTRAST
TECHNIQUE: Multiplanar, multiecho pulse sequences of the brain and surrounding
structures were obtained without intravenous contrast.

[Series 2: T1 · sagittal · 5.0mm · 0.45mm/px · 3 of 29 slices shown (1 of 2)]
[im 1/29]
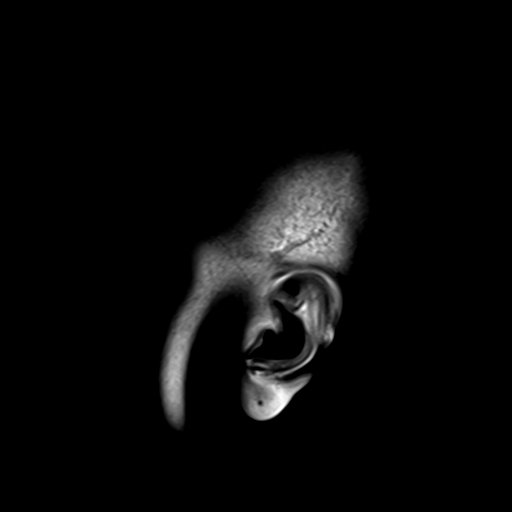
[im 15/29]
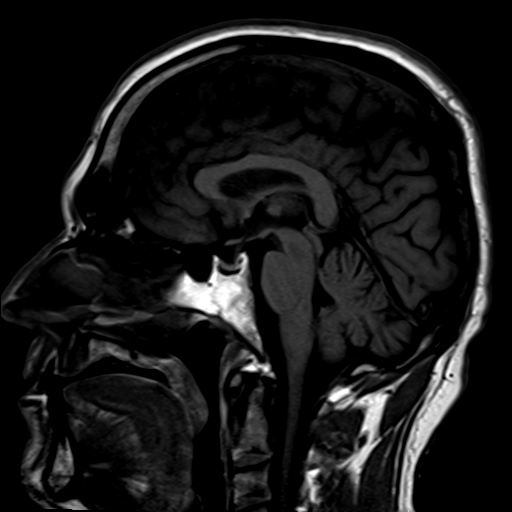
[im 29/29]
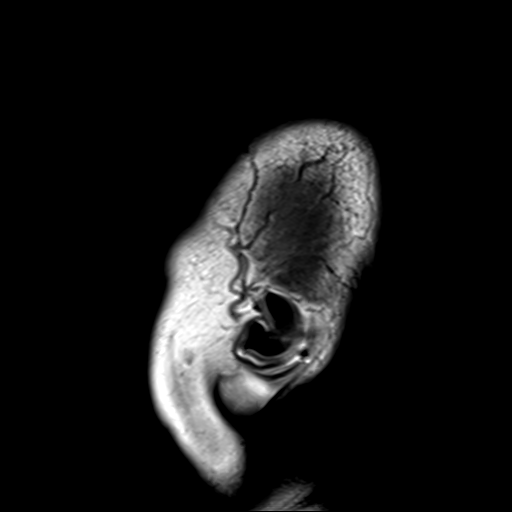

[Series 4: DWI · axial · 3.0mm · 1.80mm/px · z∈[-48,+113]mm · 7 of 55 slices shown (1 of 4)]
[im 1/55]
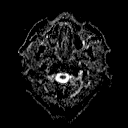
[im 10/55]
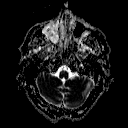
[im 19/55]
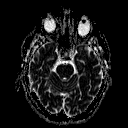
[im 28/55]
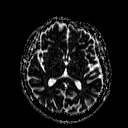
[im 37/55]
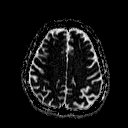
[im 46/55]
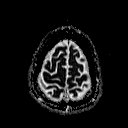
[im 55/55]
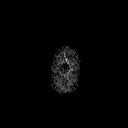

[Series 6: DWI · coronal · 3.0mm · 1.80mm/px · 6 of 48 slices shown (2 of 4)]
[im 1/48]
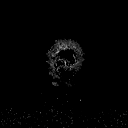
[im 10/48]
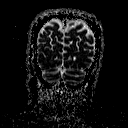
[im 19/48]
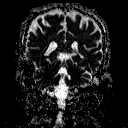
[im 29/48]
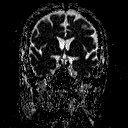
[im 38/48]
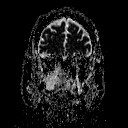
[im 48/48]
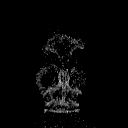

[Series 7: T2 · axial · 5.0mm · 0.60mm/px · z∈[-47,+121]mm · 3 of 27 slices shown (1 of 3)]
[im 1/27]
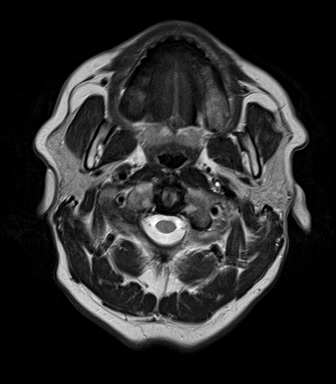
[im 14/27]
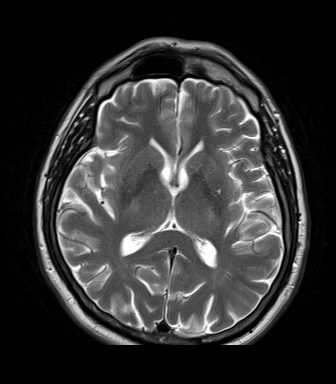
[im 27/27]
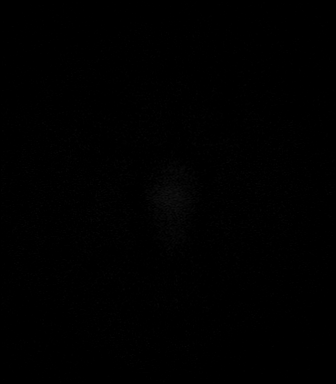

[Series 8: FLAIR · axial · 5.0mm · 0.45mm/px · z∈[-47,+121]mm · 3 of 27 slices shown]
[im 1/27]
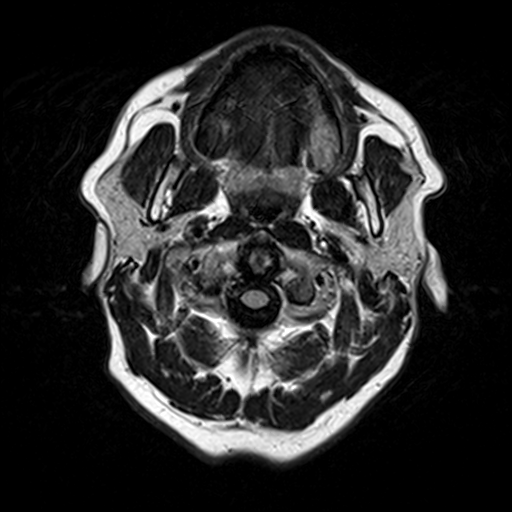
[im 14/27]
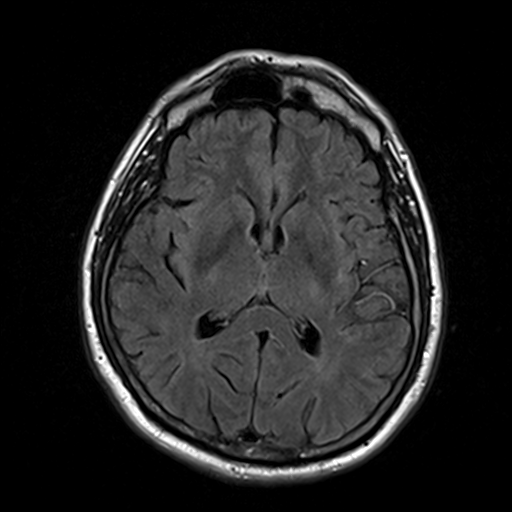
[im 27/27]
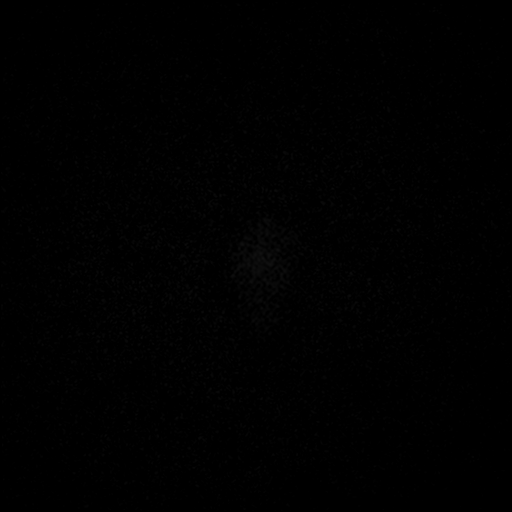

[Series 9: T2 · axial · 5.0mm · 0.45mm/px · z∈[-47,+121]mm · 3 of 27 slices shown (2 of 3)]
[im 1/27]
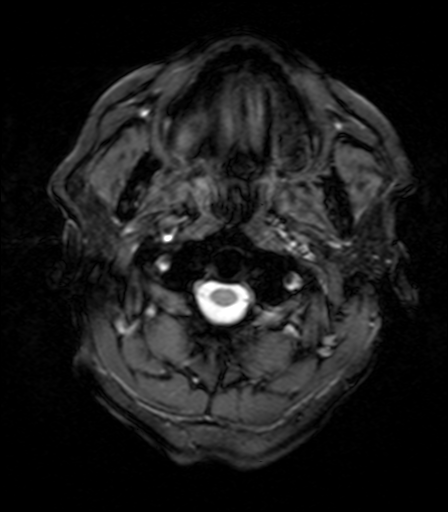
[im 14/27]
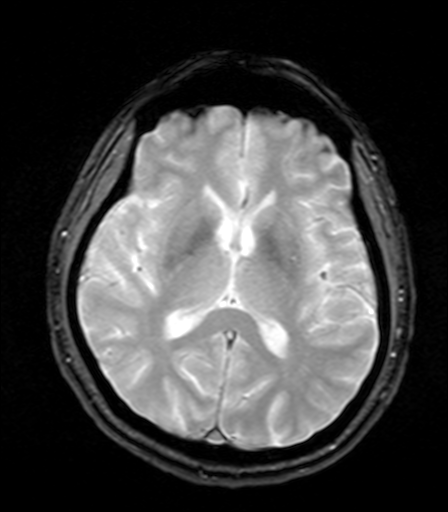
[im 27/27]
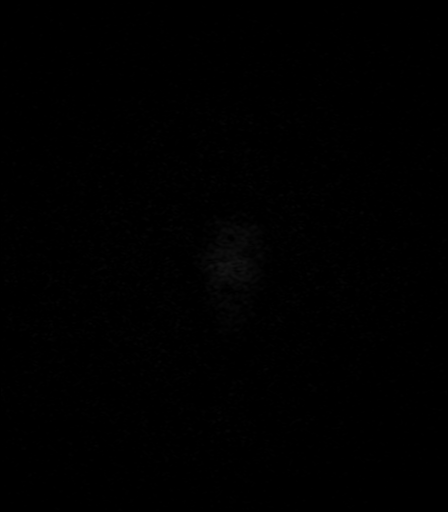

[Series 10: T1 · axial · 3.0mm · 1.00mm/px · z∈[-44,+120]mm · 7 of 56 slices shown (2 of 2)]
[im 1/56]
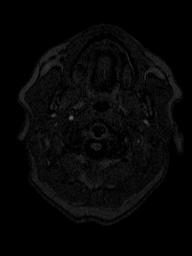
[im 10/56]
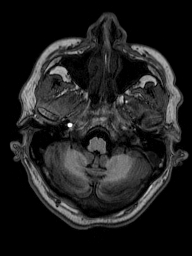
[im 19/56]
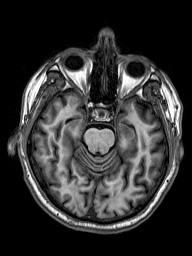
[im 28/56]
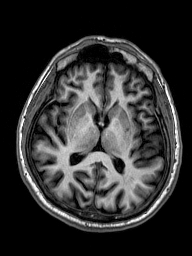
[im 37/56]
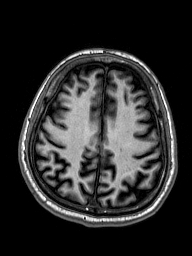
[im 46/56]
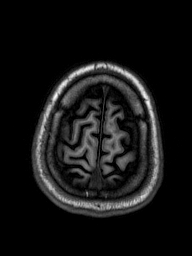
[im 56/56]
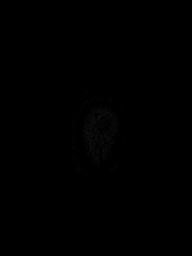

[Series 11: T2 · coronal · 5.0mm · 0.49mm/px · 3 of 29 slices shown (3 of 3)]
[im 1/29]
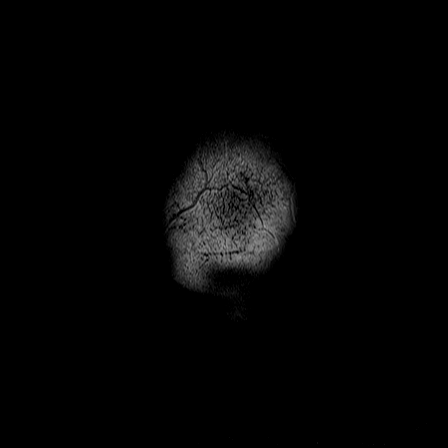
[im 15/29]
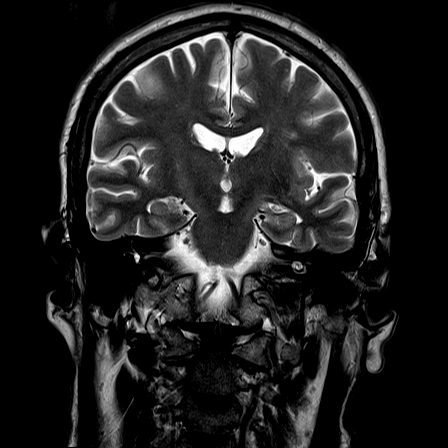
[im 29/29]
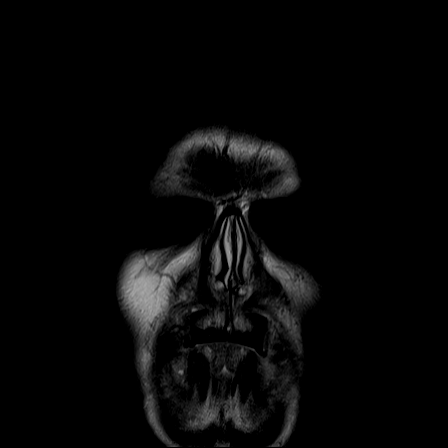

[Series 100: DWI · axial · 3.0mm · 1.80mm/px · z∈[-48,+113]mm · 7 of 55 slices shown (3 of 4)]
[im 1/55]
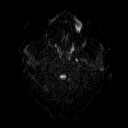
[im 10/55]
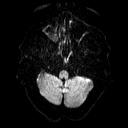
[im 19/55]
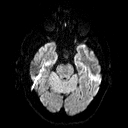
[im 28/55]
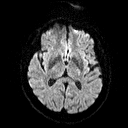
[im 37/55]
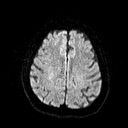
[im 46/55]
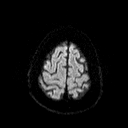
[im 55/55]
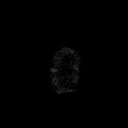

[Series 101: DWI · coronal · 3.0mm · 1.80mm/px · 6 of 48 slices shown (4 of 4)]
[im 1/48]
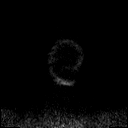
[im 10/48]
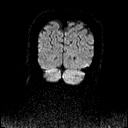
[im 19/48]
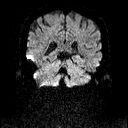
[im 29/48]
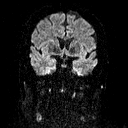
[im 38/48]
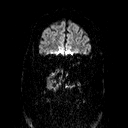
[im 48/48]
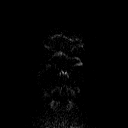

[48 of 48 positions shown; findings below may reference images not displayed]

FINDINGS: A punctate nonhemorrhagic acute/subacute infarct is present within
the left insular cortex. There is a second punctate nonhemorrhagic
infarct in the high left frontal lobe, anterior to the primary motor
cortex. No focal lesions are identified along the primary motor or
sensory cortex.

Subtle T2 changes are associated with the acute insular infarct.
Minimal scattered subcortical T2 hyperintensities are noted
otherwise.

The left internal carotid artery is occluded. Flow is present in the
right internal carotid artery and branch vessels. Flow is present in
the posterior circulation.

The right maxillary sinus is near totally opacified. Circumferential
mucosal thickening is present in the left maxillary sinus. The
paranasal sinuses are clear. There is fluid in the left mastoid air
cells. No obstructing nasopharyngeal lesion is evident.
IMPRESSION: 1. Punctate acute/subacute nonhemorrhagic infarct involving the
posterior left insular cortex.
2. The second punctate nonhemorrhagic infarct is noted in the high
left frontal lobe, separate from the primary motor or sensory
cortex.
3. Minimal scattered subcortical T2 hyperintensities otherwise or
potentially within normal limits for age.
4. Occluded left internal carotid artery.

## 2016-02-13 IMAGING — CT CT HEAD WITHOUT CONTRAST
1 series · 16 of 30 positions shown, 20 images · non-contrast
Comparison: None.

CLINICAL DATA: Pt to ed with c/o right arm numbness, weakness, and
difficulty speaking about 930 am today, reports episode lasted about
5 min and has repeated intermittantly since. Pt states this has
happened before but that it went away quickly. Pt denies hx of
stroke, seizure, or ca. Initial encounter.

EXAM:
CT HEAD WITHOUT CONTRAST
TECHNIQUE: Contiguous axial images were obtained from the base of the skull
through the vertex without intravenous contrast.

[Series 2: head wo · axial · 0.41mm/px · z∈[+9,+142]mm · 16 of 30 slices shown, 20 images]
[im 2/30  brain]
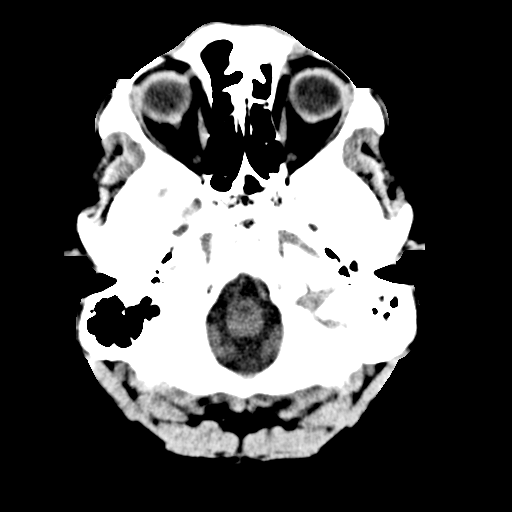
[im 2/30  bone]
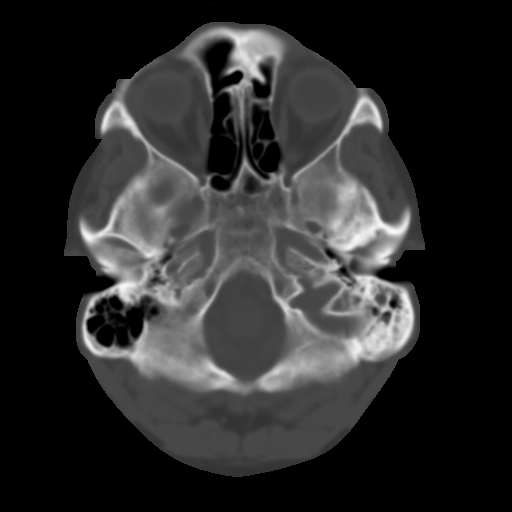
[im 4/30  brain]
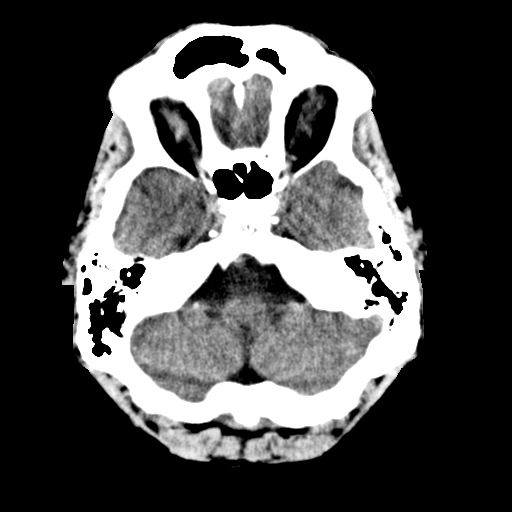
[im 6/30  brain]
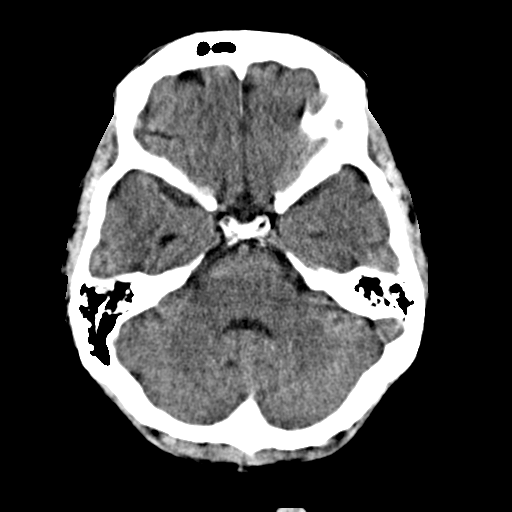
[im 8/30  brain]
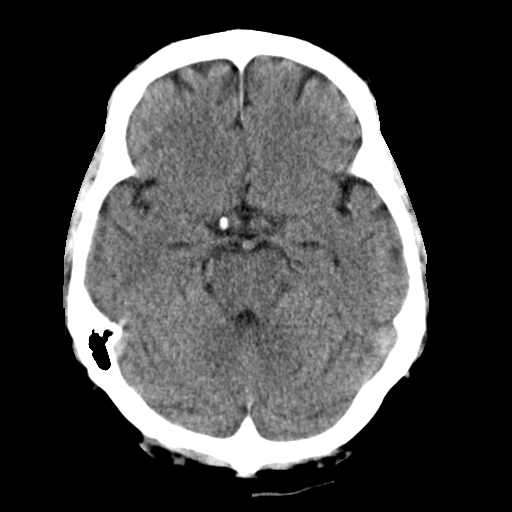
[im 9/30  brain]
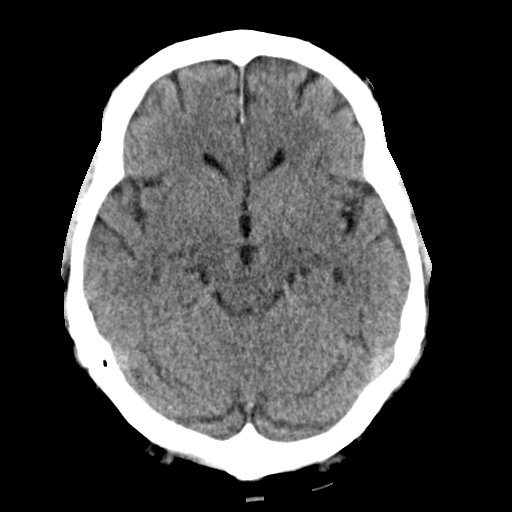
[im 9/30  bone]
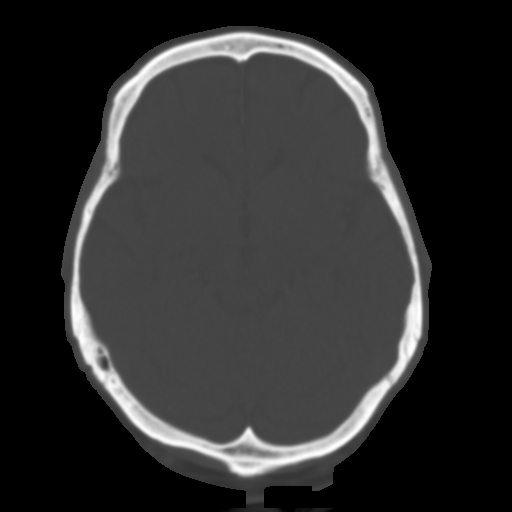
[im 11/30  brain]
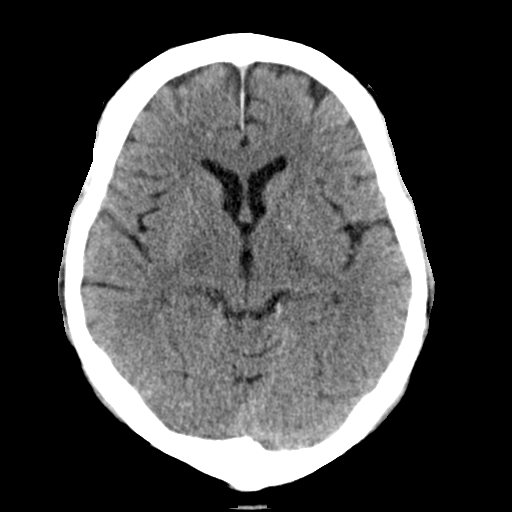
[im 13/30  brain]
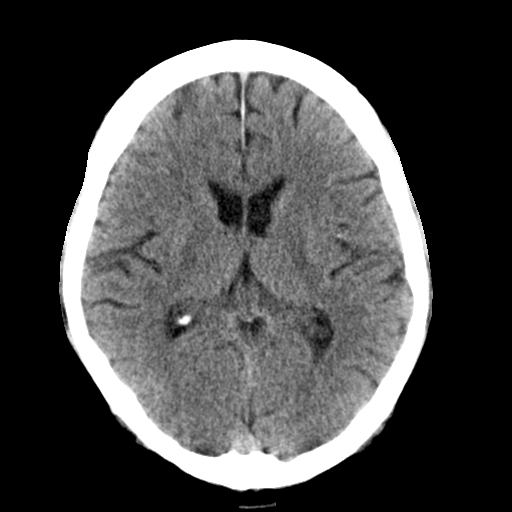
[im 15/30  brain]
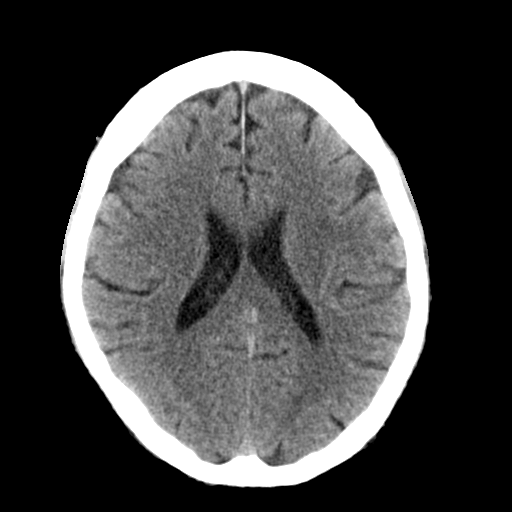
[im 16/30  brain]
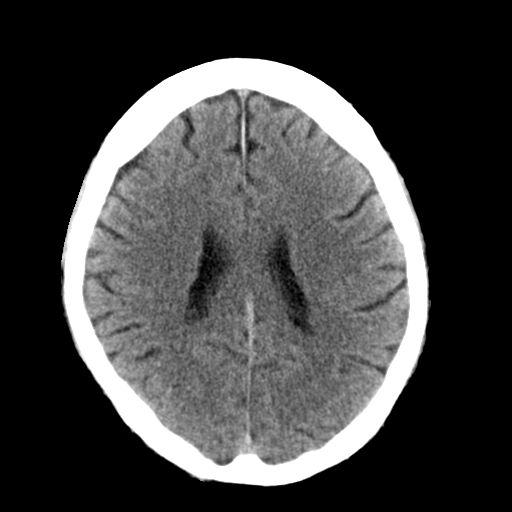
[im 16/30  bone]
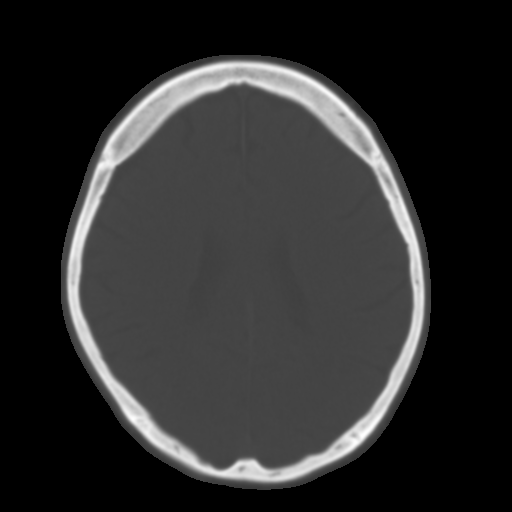
[im 18/30  brain]
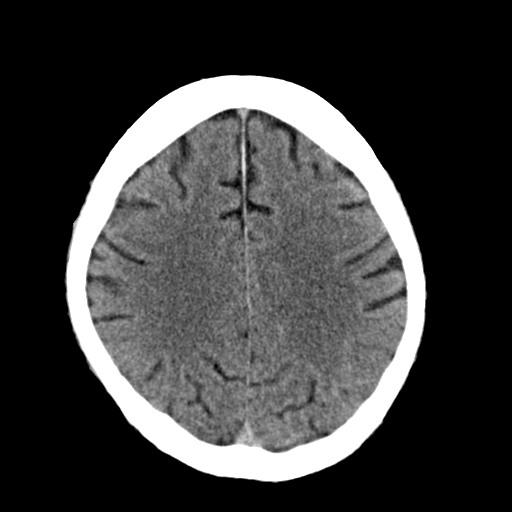
[im 20/30  brain]
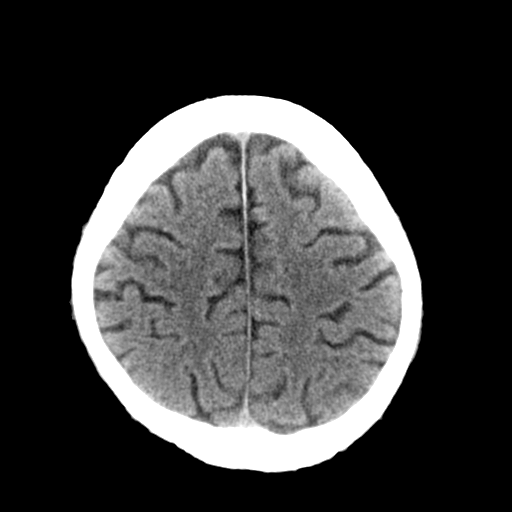
[im 22/30  brain]
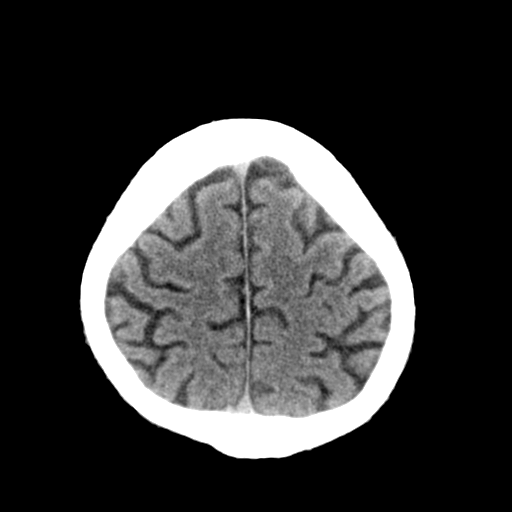
[im 23/30  brain]
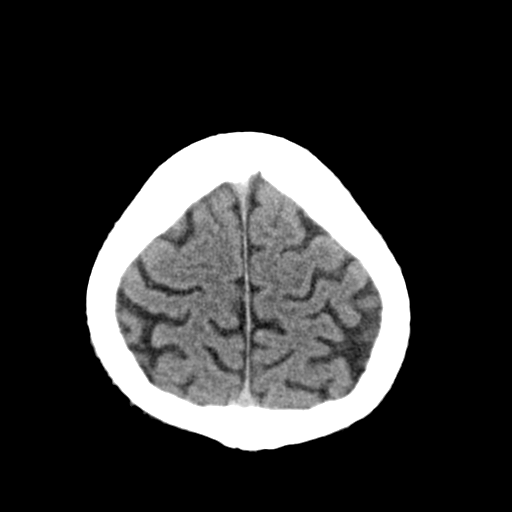
[im 23/30  bone]
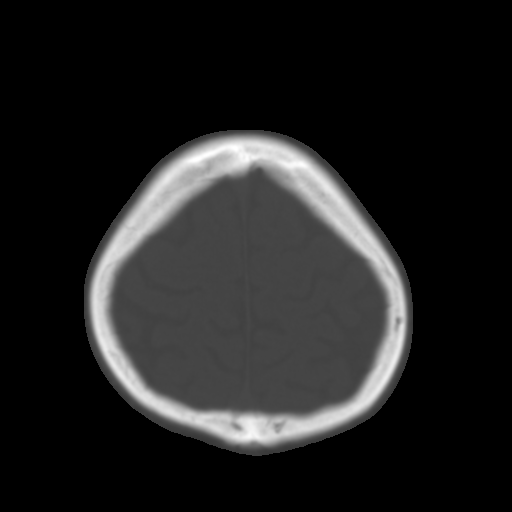
[im 25/30  brain]
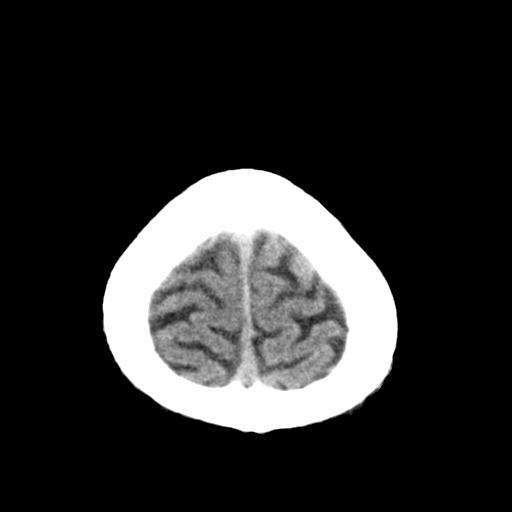
[im 27/30  brain]
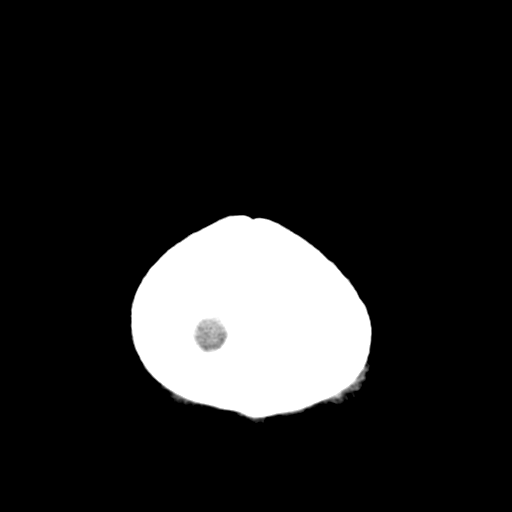
[im 29/30  brain]
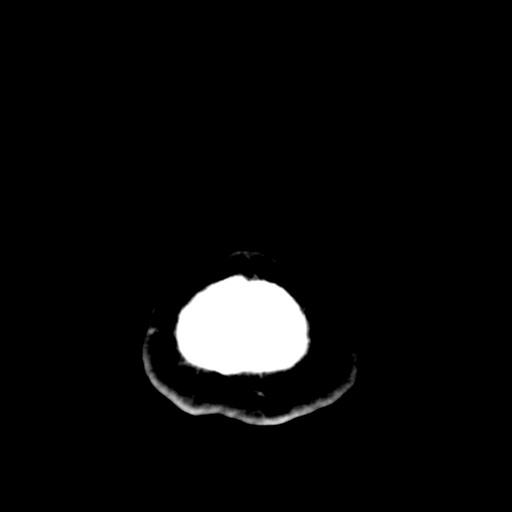

[16 of 30 positions shown; findings below may reference images not displayed]

FINDINGS: Sinuses/Soft tissues: Left mastoid effusion. Other paranasal sinuses
are clear.

Intracranial: No mass lesion, hemorrhage, hydrocephalus, acute
infarct, intra-axial, or extra-axial fluid collection.
IMPRESSION: 1.  No acute intracranial abnormality.
2. Left mastoid effusion.

## 2016-02-13 IMAGING — US US CAROTID DUPLEX BILAT
1 series · 13 of 24 positions shown · non-contrast
Comparison: None.

CLINICAL DATA: Slurred speech since this morning, right-sided
weakness, hypertension, coronary artery disease post CABG, previous
tobacco abuse

EXAM:
BILATERAL CAROTID DUPLEX ULTRASOUND
TECHNIQUE: Gray scale imaging, color Doppler and duplex ultrasound was
performed of bilateral carotid and vertebral arteries in the neck.

[Series 1: us carotid duplex bilat · 13 of 66 slices shown]
[im 1/66]
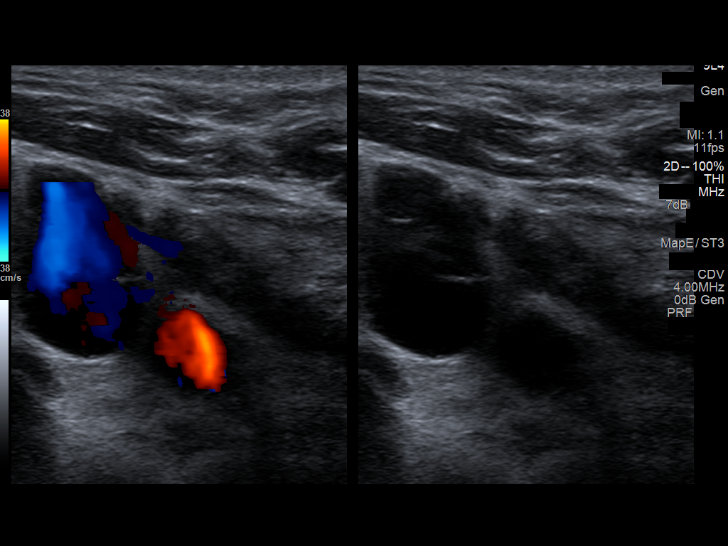
[im 6/66]
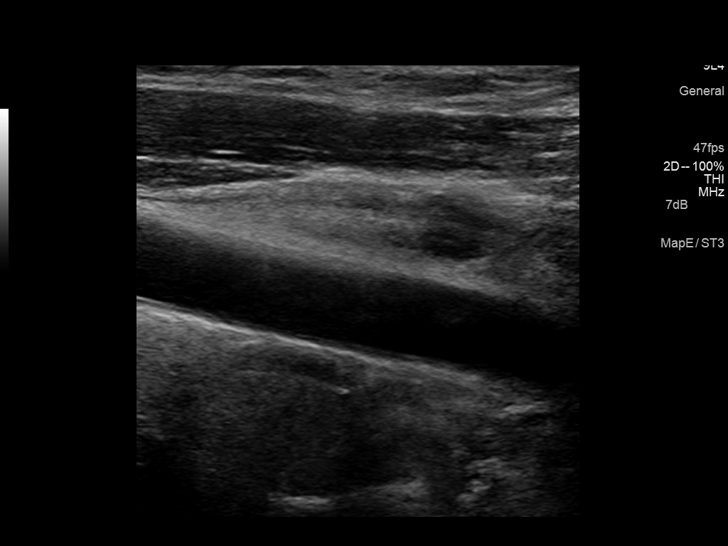
[im 12/66]
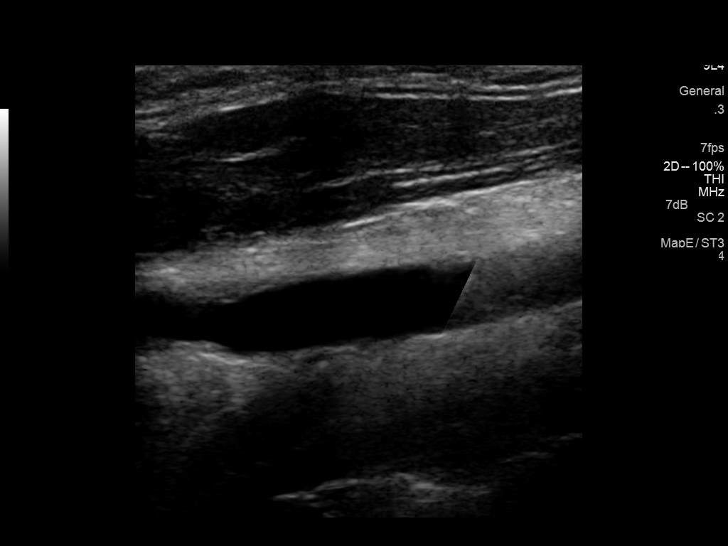
[im 17/66]
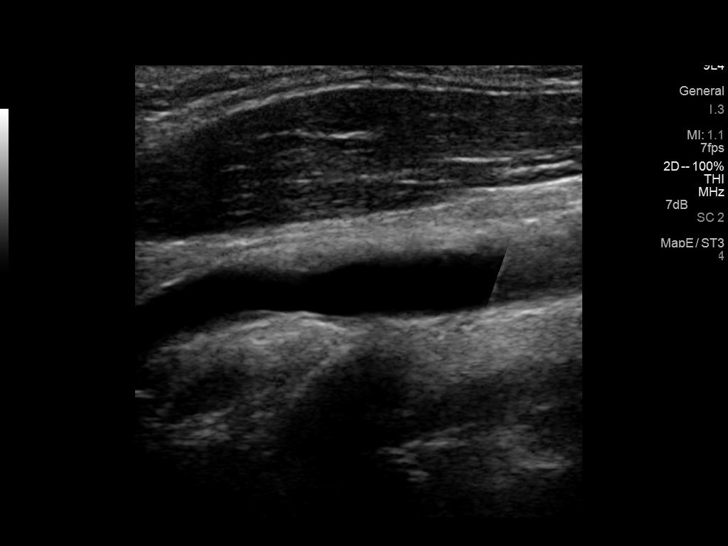
[im 23/66]
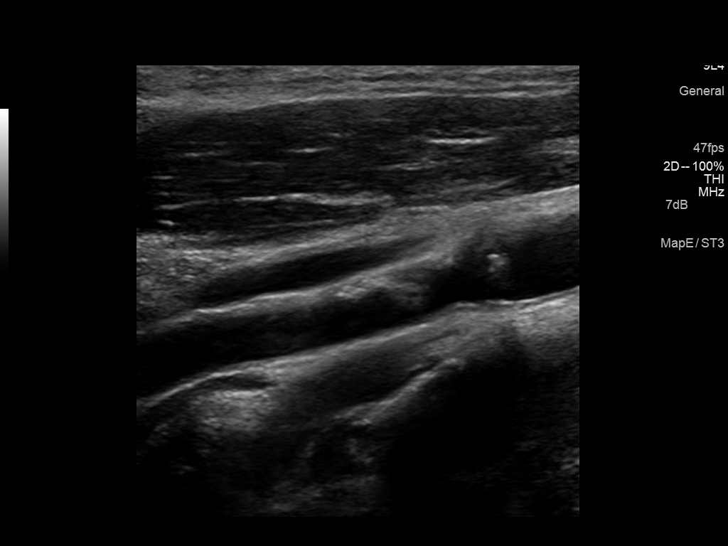
[im 29/66]
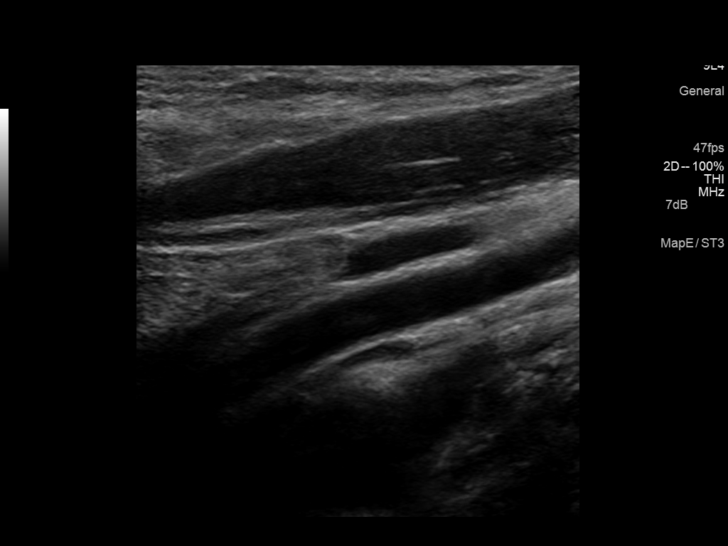
[im 34/66]
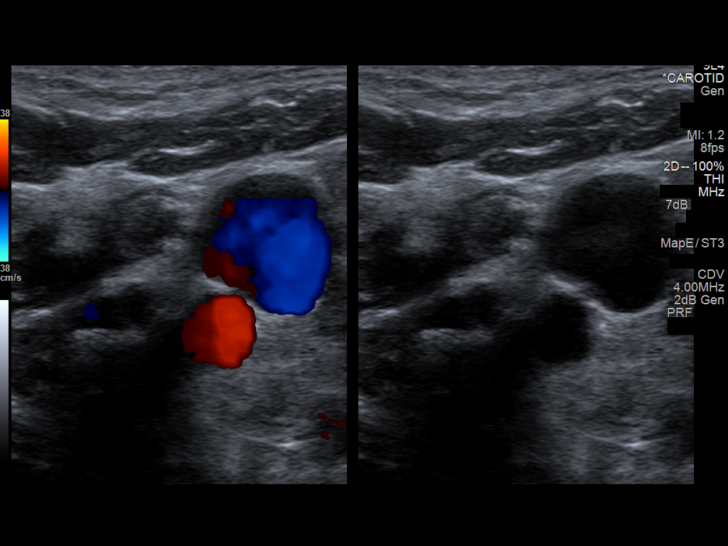
[im 37/66]
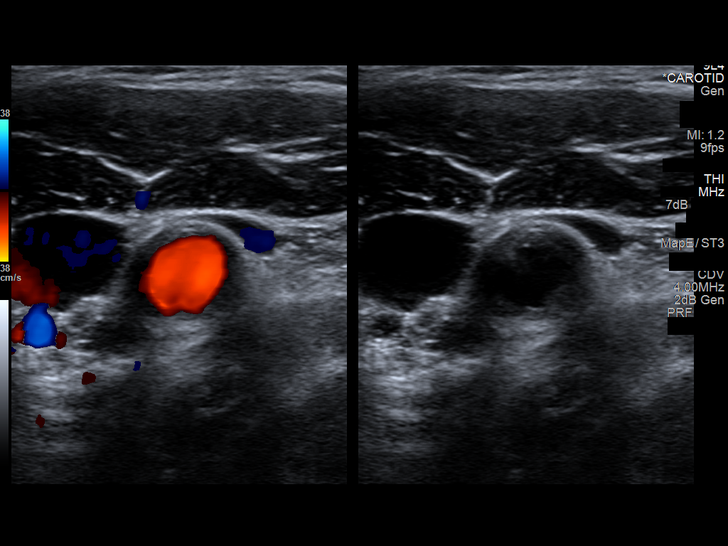
[im 43/66]
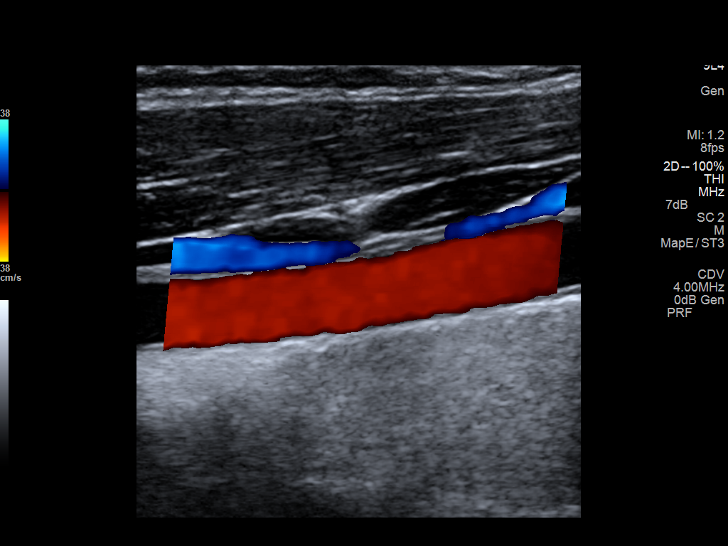
[im 49/66]
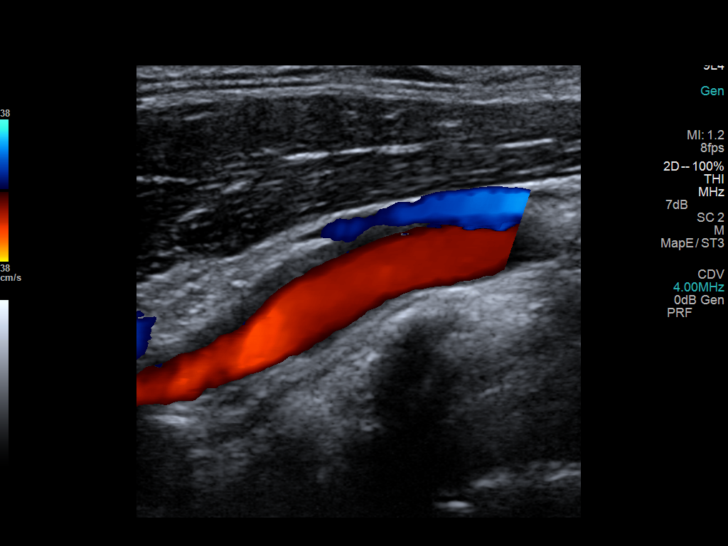
[im 54/66]
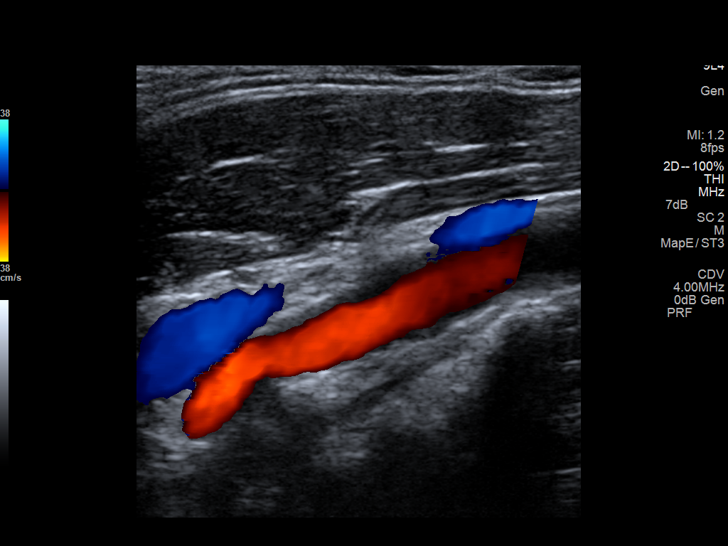
[im 60/66]
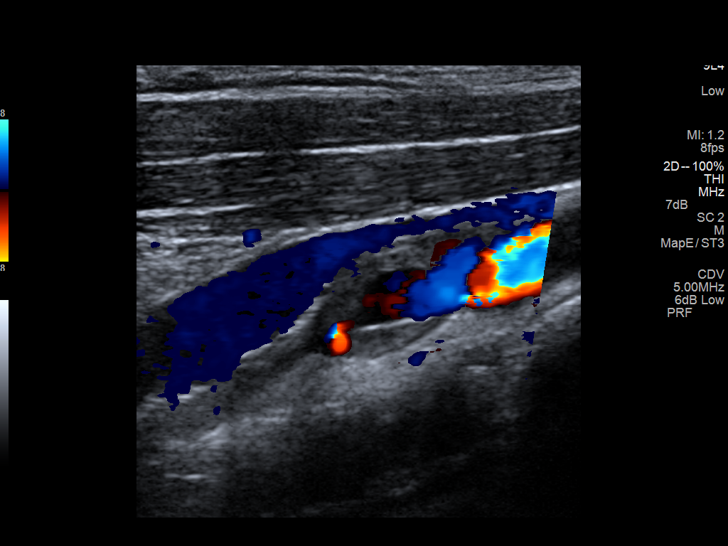
[im 66/66]
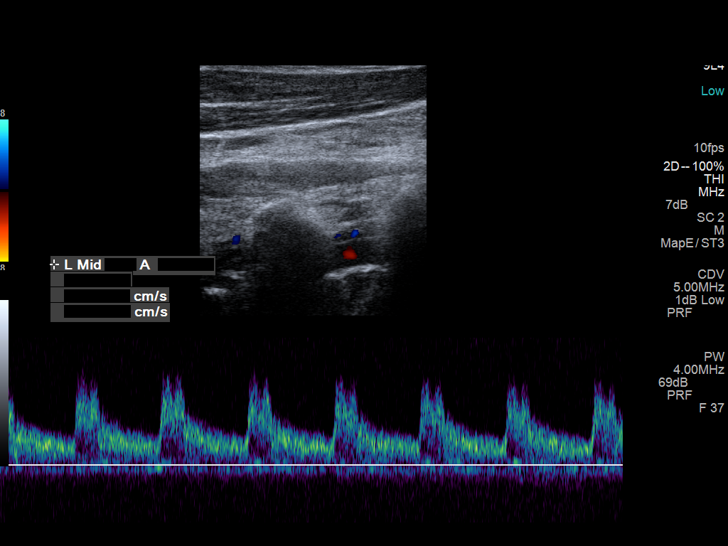

[13 of 24 positions shown; findings below may reference images not displayed]

REVIEW OF SYSTEMS:
Quantification of carotid stenosis is based on velocity parameters
that correlate the residual internal carotid diameter with
NASCET-based stenosis levels, using the diameter of the distal
internal carotid lumen as the denominator for stenosis measurement.

The following velocity measurements were obtained:

PEAK SYSTOLIC/END DIASTOLIC

RIGHT

ICA:                     237/91cm/sec

CCA:                     98/29cm/sec

SYSTOLIC ICA/CCA RATIO:

DIASTOLIC ICA/CCA RATIO:

ECA:                     159cm/sec

LEFT

ICA:                     No Flow distally

CCA:                     95/17cm/sec

ECA:                     163cm/sec
FINDINGS: RIGHT CAROTID ARTERY: There is circumferential partially calcified
plaque in the right carotid bulb extending into the proximal ICA
without high-grade stenosis. The velocities reported on the imaging
are probably overestimated due to poor angle correction. Normal
waveforms and color Doppler signal.

RIGHT VERTEBRAL ARTERY:  Normal flow direction and waveform.

LEFT CAROTID ARTERY: There circumferential plaque in the carotid
bulb. No flow signal is identified in the ICA beyond its proximal
segment on color or power Doppler.

LEFT VERTEBRAL ARTERY: Normal flow direction and waveform.
IMPRESSION: 1. Distal left ICA occlusion without string sign.
2. Proximal right ICA plaque resulting in 50- 69% diameter stenosis.
The exam does not exclude plaque ulceration or embolization.
Continued surveillance recommended.

## 2017-04-08 ENCOUNTER — Ambulatory Visit (INDEPENDENT_AMBULATORY_CARE_PROVIDER_SITE_OTHER): Payer: Self-pay | Admitting: Vascular Surgery

## 2017-04-08 ENCOUNTER — Encounter (INDEPENDENT_AMBULATORY_CARE_PROVIDER_SITE_OTHER): Payer: Self-pay

## 2017-05-25 ENCOUNTER — Other Ambulatory Visit (INDEPENDENT_AMBULATORY_CARE_PROVIDER_SITE_OTHER): Payer: Self-pay | Admitting: Vascular Surgery

## 2017-05-25 DIAGNOSIS — I779 Disorder of arteries and arterioles, unspecified: Secondary | ICD-10-CM

## 2017-05-25 DIAGNOSIS — I739 Peripheral vascular disease, unspecified: Secondary | ICD-10-CM

## 2017-05-27 ENCOUNTER — Ambulatory Visit (INDEPENDENT_AMBULATORY_CARE_PROVIDER_SITE_OTHER): Payer: BLUE CROSS/BLUE SHIELD

## 2017-05-27 ENCOUNTER — Ambulatory Visit (INDEPENDENT_AMBULATORY_CARE_PROVIDER_SITE_OTHER): Payer: BLUE CROSS/BLUE SHIELD | Admitting: Vascular Surgery

## 2017-05-27 ENCOUNTER — Encounter (INDEPENDENT_AMBULATORY_CARE_PROVIDER_SITE_OTHER): Payer: Self-pay | Admitting: Vascular Surgery

## 2017-05-27 VITALS — BP 135/85 | HR 62 | Resp 17 | Ht 70.5 in | Wt 198.0 lb

## 2017-05-27 DIAGNOSIS — I739 Peripheral vascular disease, unspecified: Secondary | ICD-10-CM

## 2017-05-27 DIAGNOSIS — I10 Essential (primary) hypertension: Secondary | ICD-10-CM | POA: Diagnosis not present

## 2017-05-27 DIAGNOSIS — I6523 Occlusion and stenosis of bilateral carotid arteries: Secondary | ICD-10-CM | POA: Diagnosis not present

## 2017-05-27 DIAGNOSIS — I779 Disorder of arteries and arterioles, unspecified: Secondary | ICD-10-CM

## 2017-05-27 DIAGNOSIS — I6529 Occlusion and stenosis of unspecified carotid artery: Secondary | ICD-10-CM | POA: Insufficient documentation

## 2017-05-27 NOTE — Assessment & Plan Note (Signed)
His ABIs are stable at 1.07 bilaterally with brisk triphasic waveforms consistent with no arterial insufficiency. His aortobifemoral bypass is patent and he really does not have much in way of symptoms at this point. Recheck in 1 year.

## 2017-05-27 NOTE — Patient Instructions (Signed)
Carotid Artery Disease The carotid arteries are arteries on both sides of the neck. They carry blood to the brain. Carotid artery disease is when the arteries get smaller (narrow) or get blocked. If these arteries get smaller or get blocked, you are more likely to have a stroke or warning stroke (transient ischemic attack). Follow these instructions at home:  Take medicines as told by your doctor. Make sure you understand all your medicine instructions. Do not stop your medicines without talking to your doctor first.  Follow your doctor's diet instructions. It is important to eat a healthy diet that includes plenty of: ? Fresh fruits. ? Vegetables. ? Lean meats.  Avoid: ? High-fat foods. ? High-sodium foods. ? Foods that are fried, overly processed, or have poor nutritional value.  Stay a healthy weight.  Stay active. Get at least 30 minutes of activity every day.  Do not smoke.  Limit alcohol use to: ? No more than 2 drinks a day for men. ? No more than 1 drink a day for women who are not pregnant.  Do not use illegal drugs.  Keep all doctor visits as told. Get help right away if:  You have sudden weakness or loss of feeling (numbness) on one side of the body, such as the face, arm, or leg.  You have sudden confusion.  You have trouble speaking (aphasia) or understanding.  You have sudden trouble seeing out of one or both eyes.  You have sudden trouble walking.  You have dizziness or feel like you might pass out (faint).  You have a loss of balance or your movements are not steady (uncoordinated).  You have a sudden, severe headache with no known cause.  You have trouble swallowing (dysphagia). Call your local emergency services (911 in [place] Do notdrive yourself to the clinic or hospital. This information is not intended to replace advice given to you by your health care provider. Make sure you discuss any questions you have with your health care  provider. Document Released: 09/27/2012 Document Revised: 03/18/2016 Document Reviewed: 04/11/2013 Elsevier Interactive Patient Education  2018 [employer]  

## 2017-05-27 NOTE — Progress Notes (Signed)
MRN : [id number]  [name] is a 61 y.o. (05-10-1956) male who presents with chief complaint of  Chief Complaint  Patient presents with  . Follow-up  .  History of Present Illness: Patient returns in follow-up multiple vascular issues. He is doing well without specific complaints today. He has intermittent lightheadedness if he moves his head in a certain direction while standing but this is unusual. He denies focal neurologic symptoms. Specifically, the patient denies amaurosis fugax, speech or swallowing difficulties, or arm or leg weakness or numbness. He also denies shortness claudication, ischemic rest pain, or ulceration. 2-3 years status post right carotid artery stent placement in about 10 years status post aortobifemoral bypass. His noninvasive studies today demonstrate a widely patent right carotid artery stent with good flow and a known left carotid artery occlusion. His ABIs are stable at 1.07 bilaterally with brisk triphasic waveforms consistent with no arterial insufficiency.  Current Outpatient Prescriptions  Medication Sig Dispense Refill  . atorvastatin (LIPITOR) 80 MG tablet   2  . clopidogrel (PLAVIX) 75 MG tablet   2  . losartan (COZAAR) 100 MG tablet   3  . Omega-3 Fatty Acids (FISH OIL) 1200 MG CAPS Take by mouth daily.    . ranitidine (ZANTAC) 150 MG tablet Take 150 mg by mouth daily.     No current facility-administered medications for this visit.     Past Medical History:  Diagnosis Date  . Hypertension     Past Surgical History:  Procedure Laterality Date  . AORTA SURGERY     bypass  . CAROTID STENT Right   . TOE SURGERY Left     Social History Social History  Substance Use Topics  . Smoking status: Former Games developermoker  . Smokeless tobacco: Never Used  . Alcohol use Yes     Comment: ocassionally  NO IVDU  Family History No bleeding or clotting disorders  No Known Allergies   REVIEW OF SYSTEMS (Negative unless checked)  Constitutional:  [] Weight loss  [] Fever  [] Chills Cardiac: [] Chest pain   [] Chest pressure   [] Palpitations   [] Shortness of breath when laying flat   [] Shortness of breath at rest   [] Shortness of breath with exertion. Vascular:  [] Pain in legs with walking   [] Pain in legs at rest   [] Pain in legs when laying flat   [] Claudication   [] Pain in feet when walking  [] Pain in feet at rest  [] Pain in feet when laying flat   [] History of DVT   [] Phlebitis   [] Swelling in legs   [] Varicose veins   [] Non-healing ulcers Pulmonary:   [] Uses home oxygen   [] Productive cough   [] Hemoptysis   [] Wheeze  [] COPD   [] Asthma Neurologic:  [] Dizziness  [] Blackouts   [] Seizures   [x] History of stroke   [] History of TIA  [] Aphasia   [] Temporary blindness   [] Dysphagia   [] Weakness or numbness in arms   [] Weakness or numbness in legs Musculoskeletal:  [] Arthritis   [] Joint swelling   [] Joint pain   [] Low back pain Hematologic:  [] Easy bruising  [] Easy bleeding   [] Hypercoagulable state   [] Anemic  [] Hepatitis Gastrointestinal:  [] Blood in stool   [] Vomiting blood  [] Gastroesophageal reflux/heartburn   [] Difficulty swallowing. Genitourinary:  [] Chronic kidney disease   [] Difficult urination  [] Frequent urination  [] Burning with urination   [] Blood in urine Skin:  [] Rashes   [] Ulcers   [] Wounds Psychological:  [] History of anxiety   []  History of major depression.  Physical Examination  Vitals:   05/27/17 1431  BP: 135/85  Pulse: 62  Resp: 17  Weight: 198 lb (89.8 kg)  Height: 5' 10.5" (1.791 m)   Body mass index is 28.01 kg/m. Gen:  WD/WN, NAD Head: [place]/AT, No temporalis wasting. Ear/Nose/Throat: Hearing grossly intact, nares w/o erythema or drainage, trachea midline Eyes: Conjunctiva clear. Sclera non-icteric Neck: Supple.  No bruit or JVD.  Pulmonary:  Good air movement, equal and clear to auscultation bilaterally.  Cardiac: RRR, normal S1, S2, no Murmurs, rubs or gallops. Vascular:  Vessel Right Left  Radial Palpable  Palpable                          PT Palpable Palpable  DP Palpable Palpable   Gas Musculoskeletal: M/S 5/5 throughout.  No deformity or atrophy. Neurologic: CN 2-12 intact. Sensation grossly intact in extremities.  Symmetrical.  Speech is fluent. Motor exam as listed above. Psychiatric: Judgment intact, Mood & affect appropriate for pt's clinical situation. Dermatologic: No rashes or ulcers noted.  No cellulitis or open wounds.      CBC Lab Results  Component Value Date   WBC 8.3 11/06/2014   HGB 9.9 (L) 11/06/2014   HCT 29.4 (L) 11/06/2014   MCV 94 11/06/2014   PLT 218 11/06/2014    BMET    Component Value Date/Time   NA 140 11/02/2014 0404   K 3.8 11/02/2014 0404   CL 111 (H) 11/02/2014 0404   CO2 22 11/02/2014 0404   GLUCOSE 108 (H) 11/02/2014 0404   BUN 14 11/02/2014 0404   CREATININE 1.19 11/07/2014 1315   CALCIUM 8.0 (L) 11/02/2014 0404   GFRNONAA >60 11/07/2014 1315   GFRAA >60 11/07/2014 1315   CrCl cannot be calculated (Patient's most recent lab result is older than the maximum 21 days allowed.).  COAG Lab Results  Component Value Date   INR 1.2 10/31/2014    Radiology No results found.   Assessment/Plan Hypertension blood pressure control important in reducing the progression of atherosclerotic disease. On appropriate oral medications.   Carotid stenosis His noninvasive studies today demonstrate a widely patent right carotid artery stent with good flow and a known left carotid artery occlusion. Continue his Plavix and Lipitor. Plan to recheck in 1 year with duplex  PVD (peripheral vascular disease) (HCC) His ABIs are stable at 1.07 bilaterally with brisk triphasic waveforms consistent with no arterial insufficiency. His aortobifemoral bypass is patent and he really does not have much in way of symptoms at this point. Recheck in 1 year.    Festus BarrenJason Zaryiah Barz, MD  05/27/2017 4:26 PM    This note was created with Dragon medical transcription  system.  Any errors from dictation are purely unintentional

## 2017-05-27 NOTE — Assessment & Plan Note (Signed)
His noninvasive studies today demonstrate a widely patent right carotid artery stent with good flow and a known left carotid artery occlusion. Continue his Plavix and Lipitor. Plan to recheck in 1 year with duplex

## 2017-05-27 NOTE — Assessment & Plan Note (Signed)
blood pressure control important in reducing the progression of atherosclerotic disease. On appropriate oral medications.  

## 2017-05-30 ENCOUNTER — Other Ambulatory Visit (INDEPENDENT_AMBULATORY_CARE_PROVIDER_SITE_OTHER): Payer: Self-pay | Admitting: Vascular Surgery

## 2017-05-30 DIAGNOSIS — I739 Peripheral vascular disease, unspecified: Secondary | ICD-10-CM

## 2018-05-30 ENCOUNTER — Ambulatory Visit (INDEPENDENT_AMBULATORY_CARE_PROVIDER_SITE_OTHER): Payer: BLUE CROSS/BLUE SHIELD | Admitting: Vascular Surgery

## 2018-05-30 ENCOUNTER — Ambulatory Visit (INDEPENDENT_AMBULATORY_CARE_PROVIDER_SITE_OTHER): Payer: BLUE CROSS/BLUE SHIELD

## 2018-05-30 ENCOUNTER — Encounter

## 2018-05-30 ENCOUNTER — Encounter (INDEPENDENT_AMBULATORY_CARE_PROVIDER_SITE_OTHER): Payer: Self-pay | Admitting: Vascular Surgery

## 2018-05-30 VITALS — BP 155/87 | HR 71 | Resp 13 | Ht 70.5 in | Wt 208.0 lb

## 2018-05-30 DIAGNOSIS — I739 Peripheral vascular disease, unspecified: Secondary | ICD-10-CM

## 2018-05-30 DIAGNOSIS — I6523 Occlusion and stenosis of bilateral carotid arteries: Secondary | ICD-10-CM | POA: Diagnosis not present

## 2018-05-30 DIAGNOSIS — I10 Essential (primary) hypertension: Secondary | ICD-10-CM | POA: Diagnosis not present

## 2018-05-30 NOTE — Assessment & Plan Note (Signed)
Carotid duplex today shows a widely patent right carotid artery stent with a known left ICA occlusion.  Continue current medical regimen.  Recheck in 1 year with carotid duplex

## 2018-05-30 NOTE — Progress Notes (Signed)
MRN : [id number]  [name] is a 62 y.o. (1956/04/24) male who presents with chief complaint of  Chief Complaint  Patient presents with  . Follow-up    1 year ABI and Carotid F/U  .  History of Present Illness: Patient follows up of multiple vascular issues.  He is about 10 years status post aortobifemoral bypass in [place].  He is about 3 years status post right carotid stenting for symptomatic stenosis with a left sided occlusion.  He currently has no lifestyle limiting claudication, ischemic rest pain, or ulceration.  He denies any fever or chills.  He has not had any focal neurologic symptoms. Carotid duplex today shows a widely patent right carotid artery stent with a known left ICA occlusion.  ABIs were normal at greater than 1 bilaterally with triphasic waveforms representing a patent aortobifemoral bypass.  Current Outpatient Medications  Medication Sig Dispense Refill  . atorvastatin (LIPITOR) 80 MG tablet   2  . clopidogrel (PLAVIX) 75 MG tablet   2  . losartan (COZAAR) 100 MG tablet   3  . Omega-3 Fatty Acids (FISH OIL) 1200 MG CAPS Take by mouth daily.    . ranitidine (ZANTAC) 150 MG tablet Take 150 mg by mouth daily.     No current facility-administered medications for this visit.     Past Medical History:  Diagnosis Date  . Carotid artery occlusion   . Hypertension     Past Surgical History:  Procedure Laterality Date  . AORTA SURGERY     bypass  . CAROTID STENT Right   . TOE SURGERY Left     Social History   Substance Use Topics   . Smoking status: Former Games developer   . Smokeless tobacco: Never Used   . Alcohol use Yes     Comment: ocassionally   NO IVDU  Family History No bleeding or clotting disorders  No Known Allergies   REVIEW OF SYSTEMS (Negative unless checked)  Constitutional: [] Weight loss  [] Fever  [] Chills Cardiac: [] Chest pain   [] Chest pressure   [] Palpitations   [] Shortness of breath when laying flat   [] Shortness of  breath at rest   [] Shortness of breath with exertion. Vascular:  [] Pain in legs with walking   [] Pain in legs at rest   [] Pain in legs when laying flat   [] Claudication   [] Pain in feet when walking  [] Pain in feet at rest  [] Pain in feet when laying flat   [] History of DVT   [] Phlebitis   [] Swelling in legs   [] Varicose veins   [] Non-healing ulcers Pulmonary:   [] Uses home oxygen   [] Productive cough   [] Hemoptysis   [] Wheeze  [] COPD   [] Asthma Neurologic:  [] Dizziness  [] Blackouts   [] Seizures   [x] History of stroke   [] History of TIA  [] Aphasia   [] Temporary blindness   [] Dysphagia   [] Weakness or numbness in arms   [] Weakness or numbness in legs Musculoskeletal:  [] Arthritis   [] Joint swelling   [] Joint pain   [] Low back pain Hematologic:  [] Easy bruising  [] Easy bleeding   [] Hypercoagulable state   [] Anemic  [] Hepatitis Gastrointestinal:  [] Blood in stool   [] Vomiting blood  [] Gastroesophageal reflux/heartburn   [] Difficulty swallowing. Genitourinary:  [] Chronic kidney disease   [] Difficult urination  [] Frequent urination  [] Burning with urination   [] Blood in urine Skin:  [] Rashes   [] Ulcers   [] Wounds Psychological:  [] History of anxiety   []  History of major depression.  Physical Examination  Vitals:   05/30/18 1459 05/30/18 1500  BP: (!) 142/86 (!) 155/87  Pulse: 71 71  Resp: 13   Weight: 208 lb (94.3 kg)   Height: 5' 10.5" (1.791 m)    Body mass index is 29.42 kg/m. Gen:  WD/WN, NAD Head: [place]/AT, No temporalis wasting. Ear/Nose/Throat: Hearing grossly intact, nares w/o erythema or drainage, trachea midline Eyes: Conjunctiva clear. Sclera non-icteric Neck: Supple.  No carotid bruit  Pulmonary:  Good air movement, equal and clear to auscultation bilaterally.  Cardiac: RRR, No JVD Vascular:  Vessel Right Left  Radial Palpable Palpable                          PT Palpable Palpable  DP Palpable Palpable    Musculoskeletal: M/S 5/5 throughout.  No deformity or  atrophy.  No significant lower extremity edema. Neurologic: CN 2-12 intact. Sensation grossly intact in extremities.  Symmetrical.  Speech is fluent. Motor exam as listed above. Psychiatric: Judgment intact, Mood & affect appropriate for pt's clinical situation. Dermatologic: No rashes or ulcers noted.  No cellulitis or open wounds.      CBC Lab Results  Component Value Date   WBC 8.3 11/06/2014   HGB 9.9 (L) 11/06/2014   HCT 29.4 (L) 11/06/2014   MCV 94 11/06/2014   PLT 218 11/06/2014    BMET    Component Value Date/Time   NA 140 11/02/2014 0404   K 3.8 11/02/2014 0404   CL 111 (H) 11/02/2014 0404   CO2 22 11/02/2014 0404   GLUCOSE 108 (H) 11/02/2014 0404   BUN 14 11/02/2014 0404   CREATININE 1.19 11/07/2014 1315   CALCIUM 8.0 (L) 11/02/2014 0404   GFRNONAA >60 11/07/2014 1315   GFRAA >60 11/07/2014 1315   CrCl cannot be calculated (Patient's most recent lab result is older than the maximum 21 days allowed.).  COAG Lab Results  Component Value Date   INR 1.2 10/31/2014    Radiology No results found.   Assessment/Plan Hypertension blood pressure control important in reducing the progression of atherosclerotic disease. On appropriate oral medications.  PVD (peripheral vascular disease) (HCC) Status [place] years ago.  No worrisome symptoms.  Perfusion stable.  Continue current medical regimen.  Carotid stenosis Carotid duplex today shows a widely patent right carotid artery stent with a known left ICA occlusion.  Continue current medical regimen.  Recheck in 1 year with carotid duplex    Festus BarrenJason Dew, MD  05/30/2018 3:27 PM    This note was created with Dragon medical transcription system.  Any errors from dictation are purely unintentional

## 2018-05-30 NOTE — Assessment & Plan Note (Signed)
Status post [place] many years ago.  No worrisome symptoms.  Perfusion stable.  Continue current medical regimen.

## 2018-06-29 DIAGNOSIS — J449 Chronic obstructive pulmonary disease, unspecified: Secondary | ICD-10-CM | POA: Insufficient documentation

## 2019-06-01 ENCOUNTER — Ambulatory Visit (INDEPENDENT_AMBULATORY_CARE_PROVIDER_SITE_OTHER): Payer: BLUE CROSS/BLUE SHIELD | Admitting: Vascular Surgery

## 2019-06-01 ENCOUNTER — Encounter (INDEPENDENT_AMBULATORY_CARE_PROVIDER_SITE_OTHER): Payer: Self-pay | Admitting: Vascular Surgery

## 2019-06-01 ENCOUNTER — Ambulatory Visit (INDEPENDENT_AMBULATORY_CARE_PROVIDER_SITE_OTHER): Payer: BLUE CROSS/BLUE SHIELD

## 2019-06-01 ENCOUNTER — Other Ambulatory Visit: Payer: Self-pay

## 2019-06-01 VITALS — BP 164/84 | HR 65 | Resp 10 | Ht 70.0 in | Wt 192.0 lb

## 2019-06-01 DIAGNOSIS — I6523 Occlusion and stenosis of bilateral carotid arteries: Secondary | ICD-10-CM | POA: Diagnosis not present

## 2019-06-01 DIAGNOSIS — I739 Peripheral vascular disease, unspecified: Secondary | ICD-10-CM

## 2019-06-01 DIAGNOSIS — I10 Essential (primary) hypertension: Secondary | ICD-10-CM

## 2019-06-01 NOTE — Progress Notes (Signed)
MRN : [id number]  Kenneth FiedlerKenneth E Dorner is a 63 y.o. (1956-05-14) male who presents with chief complaint of  Chief Complaint  Patient presents with  . Follow-up  .  History of Present Illness: Patient returns in follow-up of multiple vascular issues.  He is doing well today without any complaints.  He is over a decade status post an aortobifemoral bypass in [place].  No claudication, ischemic rest pain, or ulceration.  ABIs today are 1.18 on the right and 1.14 on the left with triphasic waveforms consistent with no arterial insufficiency and a patent bypass graft.  4 to 5 years ago he underwent a right carotid stent placement for high-grade symptomatic stenosis with stroke and a contralateral occlusion.  He has done well with no problems since that time. Duplex today shows the right carotid stent to be widely patent without any recurrent stenosis and a known left carotid occlusion.  Current Outpatient Medications  Medication Sig Dispense Refill  . amLODipine-benazepril (LOTREL) 5-20 MG capsule Take by mouth.    Marland Kitchen. atorvastatin (LIPITOR) 80 MG tablet   2  . clopidogrel (PLAVIX) 75 MG tablet   2  . Omega-3 Fatty Acids (FISH OIL) 1200 MG CAPS Take by mouth daily.    Marland Kitchen. losartan (COZAAR) 100 MG tablet   3  . ranitidine (ZANTAC) 150 MG tablet Take 150 mg by mouth daily.     No current facility-administered medications for this visit.     Past Medical History:  Diagnosis Date  . Carotid artery occlusion   . Hypertension     Past Surgical History:  Procedure Laterality Date  . AORTA SURGERY     bypass  . CAROTID STENT Right   . TOE SURGERY Left     Social History Social History   Tobacco Use  . Smoking status: Former Games developermoker  . Smokeless tobacco: Never Used  Substance Use Topics  . Alcohol use: Yes    Comment: ocassionally  . Drug use: No     Family History Family History  Problem Relation Age of Onset  . Diabetes Mother   . Heart disease Mother   no bleeding or clotting  disorders  No Known Allergies   REVIEW OF SYSTEMS (Negative unless checked)  Constitutional: [] Weight loss  [] Fever  [] Chills Cardiac: [] Chest pain   [] Chest pressure   [] Palpitations   [] Shortness of breath when laying flat   [] Shortness of breath at rest   [] Shortness of breath with exertion. Vascular:  [] Pain in legs with walking   [] Pain in legs at rest   [] Pain in legs when laying flat   [] Claudication   [] Pain in feet when walking  [] Pain in feet at rest  [] Pain in feet when laying flat   [] History of DVT   [] Phlebitis   [] Swelling in legs   [] Varicose veins   [] Non-healing ulcers Pulmonary:   [] Uses home oxygen   [] Productive cough   [] Hemoptysis   [] Wheeze  [] COPD   [] Asthma Neurologic:  [] Dizziness  [] Blackouts   [] Seizures   [x] History of stroke   [] History of TIA  [] Aphasia   [] Temporary blindness   [] Dysphagia   [] Weakness or numbness in arms   [] Weakness or numbness in legs Musculoskeletal:  [x] Arthritis   [] Joint swelling   [] Joint pain   [] Low back pain Hematologic:  [] Easy bruising  [] Easy bleeding   [] Hypercoagulable state   [] Anemic  [] Hepatitis Gastrointestinal:  [] Blood in stool   [] Vomiting blood  [] Gastroesophageal reflux/heartburn   [] Difficulty  swallowing. Genitourinary:  [] Chronic kidney disease   [] Difficult urination  [] Frequent urination  [] Burning with urination   [] Blood in urine Skin:  [] Rashes   [] Ulcers   [] Wounds Psychological:  [] History of anxiety   []  History of major depression.  Physical Examination  Vitals:   06/01/19 0932  BP: (!) 164/84  Pulse: 65  Resp: 10  Weight: 192 lb (87.1 kg)  Height: 5\' 10"  (1.778 m)   Body mass index is 27.55 kg/m. Gen:  WD/WN, NAD Head: [place]/AT, No temporalis wasting. Ear/Nose/Throat: Hearing grossly intact, nares w/o erythema or drainage, trachea midline Eyes: Conjunctiva clear. Sclera non-icteric Neck: Supple.  No bruit  Pulmonary:  Good air movement, equal and clear to auscultation bilaterally.  Cardiac: RRR,  No JVD Vascular:  Vessel Right Left  Radial Palpable Palpable                          PT Palpable Palpable  DP Palpable Palpable   Gastrointestinal: soft, non-tender/non-distended. No guarding/reflex.  Musculoskeletal: M/S 5/5 throughout.  No deformity or atrophy. No edema. Neurologic: CN 2-12 intact. Sensation grossly intact in extremities.  Symmetrical.  Speech is fluent. Motor exam as listed above. Psychiatric: Judgment intact, Mood & affect appropriate for pt's clinical situation. Dermatologic: No rashes or ulcers noted.  No cellulitis or open wounds.      CBC Lab Results  Component Value Date   WBC 8.3 11/06/2014   HGB 9.9 (L) 11/06/2014   HCT 29.4 (L) 11/06/2014   MCV 94 11/06/2014   PLT 218 11/06/2014    BMET    Component Value Date/Time   NA 140 11/02/2014 0404   K 3.8 11/02/2014 0404   CL 111 (H) 11/02/2014 0404   CO2 22 11/02/2014 0404   GLUCOSE 108 (H) 11/02/2014 0404   BUN 14 11/02/2014 0404   CREATININE 1.19 11/07/2014 1315   CALCIUM 8.0 (L) 11/02/2014 0404   GFRNONAA >60 11/07/2014 1315   GFRAA >60 11/07/2014 1315   CrCl cannot be calculated (Patient's most recent lab result is older than the maximum 21 days allowed.).  COAG Lab Results  Component Value Date   INR 1.2 10/31/2014    Radiology No results found.    Assessment/Plan Hypertension blood pressure control important in reducing the progression of atherosclerotic disease. On appropriate oral medications.   PVD (peripheral vascular disease) ([place]) ABIs today are 1.18 on the right and 1.14 on the left with triphasic waveforms consistent with no arterial insufficiency and a patent bypass graft. Continue Plavix and statin agent.  Recheck in 1 year  Carotid stenosis Duplex today shows the right carotid stent to be widely patent without any recurrent stenosis and a known left carotid occlusion.  No changes in medical regimen at this time.  Continue Plavix and statin agent.  Recheck  in 1 year    Leotis Pain, MD  06/01/2019 10:15 AM    This note was created with Dragon medical transcription system.  Any errors from dictation are purely unintentional

## 2019-06-01 NOTE — Assessment & Plan Note (Signed)
Duplex today shows the right carotid stent to be widely patent without any recurrent stenosis and a known left carotid occlusion.  No changes in medical regimen at this time.  Continue Plavix and statin agent.  Recheck in 1 year

## 2019-06-01 NOTE — Assessment & Plan Note (Addendum)
ABIs today are 1.18 on the right and 1.14 on the left with triphasic waveforms consistent with no arterial insufficiency and a patent bypass graft. Continue Plavix and statin agent.  Recheck in 1 year

## 2019-06-01 NOTE — Assessment & Plan Note (Signed)
blood pressure control important in reducing the progression of atherosclerotic disease. On appropriate oral medications.  

## 2020-01-29 DIAGNOSIS — Z23 Encounter for immunization: Secondary | ICD-10-CM | POA: Insufficient documentation

## 2020-06-03 ENCOUNTER — Encounter (INDEPENDENT_AMBULATORY_CARE_PROVIDER_SITE_OTHER): Payer: Self-pay | Admitting: Nurse Practitioner

## 2020-06-03 ENCOUNTER — Ambulatory Visit (INDEPENDENT_AMBULATORY_CARE_PROVIDER_SITE_OTHER): Payer: BC Managed Care – PPO | Admitting: Nurse Practitioner

## 2020-06-03 ENCOUNTER — Other Ambulatory Visit: Payer: Self-pay

## 2020-06-03 ENCOUNTER — Ambulatory Visit (INDEPENDENT_AMBULATORY_CARE_PROVIDER_SITE_OTHER): Payer: BC Managed Care – PPO

## 2020-06-03 ENCOUNTER — Ambulatory Visit (INDEPENDENT_AMBULATORY_CARE_PROVIDER_SITE_OTHER): Payer: BLUE CROSS/BLUE SHIELD

## 2020-06-03 VITALS — BP 129/69 | HR 66 | Resp 16 | Wt 202.6 lb

## 2020-06-03 DIAGNOSIS — I739 Peripheral vascular disease, unspecified: Secondary | ICD-10-CM

## 2020-06-03 DIAGNOSIS — I6523 Occlusion and stenosis of bilateral carotid arteries: Secondary | ICD-10-CM | POA: Diagnosis not present

## 2020-06-03 DIAGNOSIS — J449 Chronic obstructive pulmonary disease, unspecified: Secondary | ICD-10-CM | POA: Diagnosis not present

## 2020-06-03 DIAGNOSIS — I10 Essential (primary) hypertension: Secondary | ICD-10-CM | POA: Diagnosis not present

## 2020-06-03 NOTE — Progress Notes (Signed)
Subjective:    Patient ID: [name], male    DOB: 10-05-1956, 64 y.o.   MRN: [id number] Chief Complaint  Patient presents with  . Follow-up    ultrasound follow up    The patient returns to the office for followup and review of the noninvasive studies. There have been no interval changes in lower extremity symptoms. No interval shortening of the patient's claudication distance or development of rest pain symptoms. No new ulcers or wounds have occurred since the last visit. Patient has a previous history of an aortobifemoral bypass graft in 2008.  The patient is seen for follow up evaluation of carotid stenosis. The carotid stenosis followed by ultrasound. The patient had carotid stent placement in 2016 on the right.  There have been no significant changes to the patient's overall health care.  The patient is taking enteric-coated aspirin 81 mg daily.  There is no history of migraine headaches. There is no history of seizures.    The patient denies amaurosis fugax or recent TIA symptoms. There are no recent neurological changes noted. The patient denies history of DVT, PE or superficial thrombophlebitis. The patient denies recent episodes of angina or shortness of breath.   ABI Rt=1.08 and Lt=1.10  (previous ABI's Rt=1.18 and Lt=1.14) Duplex ultrasound of the bilateral tibial arteries reveals triphasic waveforms.   Duplex ultrasound shows 1-39% stenosis of the right ICA with total occlusion of the left ICA.    Review of Systems  Cardiovascular: Negative for leg swelling.  Musculoskeletal: Positive for arthralgias. Negative for neck pain and neck stiffness.  Skin: Negative for pallor, rash and wound.  Neurological: Negative for dizziness, light-headedness and numbness.  All other systems reviewed and are negative.      Objective:   Physical Exam Vitals reviewed.  HENT:     Head: Normocephalic.  Cardiovascular:     Rate and Rhythm: Normal rate and regular rhythm.       Pulses: Normal pulses.  Pulmonary:     Effort: Pulmonary effort is normal.  Skin:    General: Skin is warm and dry.     Capillary Refill: Capillary refill takes less than 2 seconds.  Neurological:     Mental Status: He is alert and oriented to person, place, and time.  Psychiatric:        Mood and Affect: Mood normal.        Behavior: Behavior normal.        Thought Content: Thought content normal.        Judgment: Judgment normal.     BP 129/69 (BP Location: Right Arm)   Pulse 66   Resp 16   Wt 202 lb 9.6 oz (91.9 kg)   BMI 29.07 kg/m   Past Medical History:  Diagnosis Date  . Carotid artery occlusion   . Hypertension     Social History   Socioeconomic History  . Marital status: Married    Spouse name: Not on file  . Number of children: Not on file  . Years of education: Not on file  . Highest education level: Not on file  Occupational History  . Not on file  Tobacco Use  . Smoking status: Former Games developer  . Smokeless tobacco: Never Used  Substance and Sexual Activity  . Alcohol use: Yes    Comment: ocassionally  . Drug use: No  . Sexual activity: Not on file  Other Topics Concern  . Not on file  Social History Narrative  . Not on  file   Social Determinants of Health   Financial Resource Strain:   . Difficulty of Paying Living Expenses:   Food Insecurity:   . Worried About Programme researcher, broadcasting/film/video in the Last Year:   . Barista in the Last Year:   Transportation Needs:   . Freight forwarder (Medical):   Marland Kitchen Lack of Transportation (Non-Medical):   Physical Activity:   . Days of Exercise per Week:   . Minutes of Exercise per Session:   Stress:   . Feeling of Stress :   Social Connections:   . Frequency of Communication with Friends and Family:   . Frequency of Social Gatherings with Friends and Family:   . Attends Religious Services:   . Active Member of Clubs or Organizations:   . Attends Banker Meetings:   Marland Kitchen Marital  Status:   Intimate Partner Violence:   . Fear of Current or Ex-Partner:   . Emotionally Abused:   Marland Kitchen Physically Abused:   . Sexually Abused:     Past Surgical History:  Procedure Laterality Date  . AORTA SURGERY     bypass  . CAROTID STENT Right   . TOE SURGERY Left     Family History  Problem Relation Age of Onset  . Diabetes Mother   . Heart disease Mother     No Known Allergies     Assessment & Plan:   1. PVD (peripheral vascular disease) (HCC)  Recommend:  The patient has evidence of atherosclerosis of the lower extremities with claudication.  The patient does not voice lifestyle limiting changes at this point in time.  Noninvasive studies do not suggest clinically significant change.  No invasive studies, angiography or surgery at this time The patient should continue walking and begin a more formal exercise program.  The patient should continue antiplatelet therapy and aggressive treatment of the lipid abnormalities  No changes in the patient's medications at this time  The patient should continue wearing graduated compression socks 10-15 mmHg strength to control the mild edema.    2. Bilateral carotid artery stenosis Recommend:  Given the patient's asymptomatic subcritical stenosis no further invasive testing or surgery at this time.  Duplex ultrasound shows 1-39% stenosis of the right ICA with total occlusion of the left ICA.   Continue antiplatelet therapy as prescribed Continue management of CAD, HTN and Hyperlipidemia Healthy heart diet,  encouraged exercise at least 4 times per week Follow up in 12 months with duplex ultrasound and physical exam   3. Essential hypertension Continue antihypertensive medications as already ordered, these medications have been reviewed and there are no changes at this time.   4. COPD, mild (HCC) Continue pulmonary medications and aerosols as already ordered, these medications have been reviewed and there are no  changes at this time.     Current Outpatient Medications on File Prior to Visit  Medication Sig Dispense Refill  . albuterol (PROAIR HFA) 108 (90 Base) MCG/ACT inhaler INHALE 2 INHALATIONS INTO THE LUNGS EVERY 6 HOURS AS NEEDED FOR WHEEZING    . amLODipine-benazepril (LOTREL) 5-20 MG capsule Take by mouth.    Marland Kitchen aspirin EC 81 MG tablet Take 81 mg by mouth daily. Swallow whole.    Marland Kitchen atorvastatin (LIPITOR) 80 MG tablet   2  . beclomethasone (QVAR) 80 MCG/ACT inhaler Take 2 puffs bid as a prevention inhaler    . clopidogrel (PLAVIX) 75 MG tablet   2  . fluticasone (FLONASE) 50 MCG/ACT nasal  spray Place 2 sprays into both nostrils as needed.     . Omega-3 Fatty Acids (FISH OIL) 1200 MG CAPS Take by mouth daily.    . Tiotropium Bromide Monohydrate (SPIRIVA RESPIMAT) 1.25 MCG/ACT AERS Inhale into the lungs.    . Turmeric Curcumin 500 MG CAPS Take by mouth.    . losartan (COZAAR) 100 MG tablet  (Patient not taking: Reported on 06/03/2020)  3   No current facility-administered medications on file prior to visit.    There are no Patient Instructions on file for this visit. No follow-ups on file.   Georgiana Spinner, NP

## 2021-05-27 ENCOUNTER — Other Ambulatory Visit (INDEPENDENT_AMBULATORY_CARE_PROVIDER_SITE_OTHER): Payer: Self-pay | Admitting: Vascular Surgery

## 2021-05-27 DIAGNOSIS — I6523 Occlusion and stenosis of bilateral carotid arteries: Secondary | ICD-10-CM

## 2021-05-27 DIAGNOSIS — I739 Peripheral vascular disease, unspecified: Secondary | ICD-10-CM

## 2021-05-29 ENCOUNTER — Other Ambulatory Visit: Payer: Self-pay | Admitting: Gerontology

## 2021-05-29 ENCOUNTER — Other Ambulatory Visit: Payer: Self-pay

## 2021-05-29 ENCOUNTER — Ambulatory Visit
Admission: RE | Admit: 2021-05-29 | Discharge: 2021-05-29 | Disposition: A | Discharge status: Home / Self Care | Payer: Medicare Other | Source: Ambulatory Visit | Attending: Gerontology | Admitting: Gerontology

## 2021-05-29 DIAGNOSIS — M25562 Pain in left knee: Secondary | ICD-10-CM

## 2021-05-29 DIAGNOSIS — M25472 Effusion, left ankle: Secondary | ICD-10-CM | POA: Diagnosis present

## 2021-05-29 DIAGNOSIS — M25462 Effusion, left knee: Secondary | ICD-10-CM | POA: Diagnosis present

## 2021-06-02 ENCOUNTER — Ambulatory Visit (INDEPENDENT_AMBULATORY_CARE_PROVIDER_SITE_OTHER): Payer: BC Managed Care – PPO | Admitting: Vascular Surgery

## 2021-06-02 ENCOUNTER — Encounter (INDEPENDENT_AMBULATORY_CARE_PROVIDER_SITE_OTHER): Payer: Self-pay | Admitting: Vascular Surgery

## 2021-06-02 ENCOUNTER — Encounter (INDEPENDENT_AMBULATORY_CARE_PROVIDER_SITE_OTHER): Payer: BC Managed Care – PPO
